# Patient Record
Sex: Female | Born: 1955 | Race: White | Hispanic: No | Smoking: Never smoker
Health system: Southern US, Community
[De-identification: ages and names within clinical notes are randomized; demographics above are authoritative.]

## PROBLEM LIST (undated history)

## (undated) DIAGNOSIS — R87619 Unspecified abnormal cytological findings in specimens from cervix uteri: Secondary | ICD-10-CM

## (undated) DIAGNOSIS — I2510 Atherosclerotic heart disease of native coronary artery without angina pectoris: Secondary | ICD-10-CM

## (undated) DIAGNOSIS — R55 Syncope and collapse: Secondary | ICD-10-CM

## (undated) DIAGNOSIS — K219 Gastro-esophageal reflux disease without esophagitis: Secondary | ICD-10-CM

## (undated) DIAGNOSIS — Z8719 Personal history of other diseases of the digestive system: Secondary | ICD-10-CM

## (undated) DIAGNOSIS — I10 Essential (primary) hypertension: Secondary | ICD-10-CM

## (undated) DIAGNOSIS — F329 Major depressive disorder, single episode, unspecified: Secondary | ICD-10-CM

## (undated) DIAGNOSIS — G459 Transient cerebral ischemic attack, unspecified: Secondary | ICD-10-CM

## (undated) DIAGNOSIS — Z87898 Personal history of other specified conditions: Secondary | ICD-10-CM

## (undated) DIAGNOSIS — F32A Depression, unspecified: Secondary | ICD-10-CM

## (undated) HISTORY — DX: Essential (primary) hypertension: I10

## (undated) HISTORY — PX: OTHER SURGICAL HISTORY: SHX169

## (undated) HISTORY — DX: Syncope and collapse: R55

## (undated) HISTORY — DX: Personal history of other specified conditions: Z87.898

## (undated) HISTORY — PX: ESOPHAGOGASTRODUODENOSCOPY: SHX1529

## (undated) HISTORY — DX: Unspecified abnormal cytological findings in specimens from cervix uteri: R87.619

## (undated) HISTORY — PX: COLONOSCOPY: SHX174

## (undated) HISTORY — DX: Depression, unspecified: F32.A

## (undated) HISTORY — DX: Major depressive disorder, single episode, unspecified: F32.9

---

## 1978-11-23 HISTORY — PX: DILATION AND CURETTAGE OF UTERUS: SHX78

## 1981-11-23 HISTORY — PX: BREAST SURGERY: SHX581

## 1999-05-22 ENCOUNTER — Other Ambulatory Visit: Admission: RE | Admit: 1999-05-22 | Discharge: 1999-05-22 | Payer: Self-pay

## 2000-07-22 ENCOUNTER — Other Ambulatory Visit: Admission: RE | Admit: 2000-07-22 | Discharge: 2000-07-22 | Payer: Self-pay | Admitting: *Deleted

## 2000-11-03 ENCOUNTER — Encounter (INDEPENDENT_AMBULATORY_CARE_PROVIDER_SITE_OTHER): Payer: Self-pay | Admitting: Specialist

## 2000-11-03 ENCOUNTER — Ambulatory Visit (HOSPITAL_COMMUNITY): Admission: RE | Admit: 2000-11-03 | Discharge: 2000-11-03 | Payer: Self-pay | Admitting: Gastroenterology

## 2003-11-24 DIAGNOSIS — R87619 Unspecified abnormal cytological findings in specimens from cervix uteri: Secondary | ICD-10-CM

## 2003-11-24 HISTORY — DX: Unspecified abnormal cytological findings in specimens from cervix uteri: R87.619

## 2004-05-14 ENCOUNTER — Other Ambulatory Visit: Admission: RE | Admit: 2004-05-14 | Discharge: 2004-05-14 | Payer: Self-pay | Admitting: Internal Medicine

## 2004-06-10 ENCOUNTER — Other Ambulatory Visit: Admission: RE | Admit: 2004-06-10 | Discharge: 2004-06-10 | Payer: Self-pay | Admitting: Obstetrics and Gynecology

## 2005-05-14 ENCOUNTER — Other Ambulatory Visit: Admission: RE | Admit: 2005-05-14 | Discharge: 2005-05-14 | Payer: Self-pay | Admitting: Obstetrics and Gynecology

## 2005-11-05 ENCOUNTER — Other Ambulatory Visit: Admission: RE | Admit: 2005-11-05 | Discharge: 2005-11-05 | Payer: Self-pay | Admitting: Obstetrics and Gynecology

## 2006-01-14 ENCOUNTER — Ambulatory Visit (HOSPITAL_COMMUNITY): Admission: RE | Admit: 2006-01-14 | Discharge: 2006-01-14 | Payer: Self-pay | Admitting: Obstetrics and Gynecology

## 2006-01-14 ENCOUNTER — Encounter (INDEPENDENT_AMBULATORY_CARE_PROVIDER_SITE_OTHER): Payer: Self-pay | Admitting: *Deleted

## 2006-05-13 ENCOUNTER — Other Ambulatory Visit: Admission: RE | Admit: 2006-05-13 | Discharge: 2006-05-13 | Payer: Self-pay | Admitting: Obstetrics and Gynecology

## 2006-12-01 ENCOUNTER — Other Ambulatory Visit: Admission: RE | Admit: 2006-12-01 | Discharge: 2006-12-01 | Payer: Self-pay | Admitting: Obstetrics and Gynecology

## 2008-12-14 DIAGNOSIS — R55 Syncope and collapse: Secondary | ICD-10-CM

## 2008-12-14 HISTORY — DX: Syncope and collapse: R55

## 2010-08-12 ENCOUNTER — Emergency Department (HOSPITAL_COMMUNITY): Admission: EM | Admit: 2010-08-12 | Discharge: 2010-08-12 | Payer: Self-pay | Admitting: Emergency Medicine

## 2011-02-05 LAB — PROTIME-INR
INR: 0.97 (ref 0.00–1.49)
Prothrombin Time: 13.1 seconds (ref 11.6–15.2)

## 2011-02-05 LAB — CBC
MCH: 30.9 pg (ref 26.0–34.0)
MCV: 87.4 fL (ref 78.0–100.0)
Platelets: 202 10*3/uL (ref 150–400)
RDW: 13.2 % (ref 11.5–15.5)
WBC: 9.8 10*3/uL (ref 4.0–10.5)

## 2011-02-05 LAB — DIFFERENTIAL
Lymphocytes Relative: 25 % (ref 12–46)
Lymphs Abs: 2.5 10*3/uL (ref 0.7–4.0)
Monocytes Absolute: 0.5 10*3/uL (ref 0.1–1.0)
Monocytes Relative: 5 % (ref 3–12)
Neutro Abs: 6.7 10*3/uL (ref 1.7–7.7)

## 2011-02-05 LAB — COMPREHENSIVE METABOLIC PANEL
Albumin: 4.1 g/dL (ref 3.5–5.2)
BUN: 16 mg/dL (ref 6–23)
Creatinine, Ser: 0.8 mg/dL (ref 0.4–1.2)
Total Protein: 7.5 g/dL (ref 6.0–8.3)

## 2011-02-05 LAB — POCT CARDIAC MARKERS
CKMB, poc: <1 ng/mL — ABNORMAL LOW (ref 1.0–8.0)
CKMB, poc: <1 ng/mL — ABNORMAL LOW (ref 1.0–8.0)
Myoglobin, poc: 47.3 ng/mL (ref 12–200)

## 2011-04-10 NOTE — H&P (Signed)
Kristen [name], Kristen [name]             ACCOUNT NO.:  [id number]   MEDICAL RECORD NO.:  [id number]          PATIENT TYPE:  AMB   LOCATION:                                FACILITY:  [facility]   PHYSICIAN:  Gerald Leitz, MD          DATE OF BIRTH:  06-21-1956   DATE OF ADMISSION:  01/14/2006  DATE OF DISCHARGE:                                HISTORY & PHYSICAL   HISTORY OF PRESENT ILLNESS:  This is a 55 year old G1, P1 with a history of  cervical dysplasia.  She has recurrent cervical dysplasia CIN I.  She also  has endocervical polyps on examination that were difficult to remove in the  office and is here to undergo cold knife conization for hopefully definitive  therapy.  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   PAST MEDICAL HISTORY:  1.  Depression.  2.  Hypertension.  3.  Acid reflux.   PAST SURGICAL HISTORY:  1.  D&C.  2.  Breast biopsy which was benign.   PAST OB HISTORY:  Spontaneous vaginal delivery x1.   MEDICATIONS:  1.  Prozac 20 mg three p.o. daily.  2.  Benazepril for hypertension.  3.  Hydrochlorothiazide.  4.  Prevacid.   ALLERGIES:  No known drug allergies.   SOCIAL:  She denies tobacco, alcohol, or illicit drug use.  She is single.   FAMILY HISTORY:  No family history of breast, ovarian, or colon cancer.   REVIEW OF SYSTEMS:  Negative except as stated in history of current illness.   PHYSICAL EXAMINATION:  VITAL SIGNS:  Blood pressure 144/86, heart rate 64,  weight 210 pounds, height 5 feet 5-1/2 inches.  CARDIOVASCULAR:  Regular rate and rhythm.  LUNGS:  Clear to auscultation bilaterally.  ABDOMEN:  Soft, nontender, nondistended.  No masses.  Positive bowel sounds.  EXTREMITIES:  No clubbing, cyanosis, edema.  PELVIC:  Normal external female genitalia.  No vulvar or vaginal lesions.  Polyps noted in the endocervical canal.  Bimanual examination revealed  normal sized uterus.  No adnexal masses or tenderness.   IMPRESSION AND PLAN:  Recurrent cervical dysplasia and  endocervical polyps.  Recommend cold knife conization.  Risks, benefits, and alternatives of  surgery were discussed with the patient including infection, bleeding,  damage to surrounding organs, risk of transfusion were reviewed with the  patient including HIV, hepatitis B, C.  Patient understands risks and wishes  to proceed with cold knife conization.      Gerald Leitz, MD  Electronically Signed     TC/MEDQ  D:  01/07/2006  T:  01/07/2006  Job:  [id number]

## 2011-04-10 NOTE — Op Note (Signed)
Kristen [name], CHANDRAN             ACCOUNT NO.:  [id number]   MEDICAL RECORD NO.:  [id number]          PATIENT TYPE:  AMB   LOCATION:  SDC                           FACILITY:  [facility]   PHYSICIAN:  Gerald Leitz, MD          DATE OF BIRTH:  1956-01-10   DATE OF PROCEDURE:  01/15/2006  DATE OF DISCHARGE:                                 OPERATIVE REPORT   PREOPERATIVE DIAGNOSES:  1.  _recurrent  cervical dysplasia.  2.  Endocervical polyp.   POSTOPERATIVE DIAGNOSES:  1.  recurrent  cervical dysplasia.  2.  Endocervical polyp.   OPERATION/PROCEDURE:  Cold knife conization.   ANESTHESIA:  General.   COMPLICATIONS:  None.   SURGEON:  Gerald Leitz, M.D.   ESTIMATED BLOOD LOSS:  Approximately 25 mL.   FINDINGS:  Endocervical polyp.   DESCRIPTION OF PROCEDURE:  The patient was taken to the operating room where  she was placed under general anesthesia.  She was prepped and draped in the  usual sterile fashion.  The anterior lip of the cervix grasped with a single-  tooth tenaculum and paracervical block was performed using approximately 18  mL of 0.25% Marcaine with epinephrine.  A 0 Vicryl suture was placed at the  lateral aspect of the cervix at the 3 o'clock and 9 o'clock positions to  help with traction and hemostasis.  The single-tooth tenaculum on the  anterior lip of the cervix was removed. Cervical conization was performed  with an 11-blade scalpel.  Specimen removed and sent to pathology.  It was  tagged at the 12 o'clock position.  Hemostasis was obtained using Monsel's  and pressure.  Once hemostasis was assured, Surgicel was placed into the  incision.  Lateral sutures were tied over the Surgicel.  All instruments  were removed from the patient's vagina.  The patient tolerated the procedure  well.  Sponge, lap and needle counts were correct x2.  The patient was  awakened from anesthesia and taken to the recovery room in stable condition.      Gerald Leitz, MD  Electronically  Signed     TC/MEDQ  D:  01/15/2006  T:  01/15/2006  Job:  [id number]

## 2011-04-10 NOTE — Op Note (Signed)
[facility]  Patient:    Kristen [name], Kristen [name]                    MRN: [id number] Adm. Date:  [id number] Attending:  Nelda Marseille CC:         Charlesetta Shanks, P.A., [facility] Leodis Sias, M.D.)   Operative Report  PROCEDURE:  Esophagogastroduodenoscopy with biopsy.  PREOPERATIVE DIAGNOSIS:  Chronic upper tract symptoms.  INDICATIONS:  Consent was signed after risks, benefits, methods, and options were thoroughly discussed in the office.  MEDICINES USED:  Demerol 70, Versed 10.  DESCRIPTION OF PROCEDURE:  The video endoscope was inserted by direct vision. The esophagus was normal.  In the distal esophagus was a moderate hiatal hernia, possibly a small fibrous widely-patent ring just above it.  The scope was passed easily into the stomach and advanced through the antrum and apart from some minimal antritis through a normal pylorus into a normal duodenum bulb and around the C-loop to a normal second portion of the duodenum.  The scope was withdrawn back to the bulb and a good look ruled out ulcers in that location.  The scope was withdrawn back to the stomach and retroflexed.  The angularis, cardia and fundus were pertinent for the cardia showing the hiatal hernia but no other abnormality.  The stomach was evaluated on straightened visualization with some minimal gastritis being seen but no other abnormalities.  The scope was advanced to the antrum.  Two biopsies there and two of the proximal stomach were obtained to rule out Helicobacter. Air was suctioned and the scope was slowly withdrawn.  Again, a good look at the esophagus on slow withdrawal was normal.  There was no sign of esophagitis or Barretts.  The scope was removed.  The patient tolerated the procedure well.  There were no obvious immediate complications.  ENDOSCOPIC DIAGNOSES: 1. Moderate hiatal hernia with a questionably thin widely-patent ring. 2. Minimal gastritis and  antritis, status post biopsy to rule Helicobacter    pylori. 3. Otherwise within normal limits esophagogastroduodenoscopy.  PLAN:  Continue medications.  Await pathology.  Follow-up p.r.n. or in three to four months to determine any further workup and plans and rediscuss possible surgical options. DD:  11/03/00 TD:  11/03/00 Job: [id number]

## 2012-05-04 ENCOUNTER — Encounter: Payer: Self-pay | Admitting: *Deleted

## 2012-05-04 DIAGNOSIS — Z87898 Personal history of other specified conditions: Secondary | ICD-10-CM | POA: Insufficient documentation

## 2012-05-04 DIAGNOSIS — F329 Major depressive disorder, single episode, unspecified: Secondary | ICD-10-CM | POA: Insufficient documentation

## 2012-05-04 DIAGNOSIS — F32A Depression, unspecified: Secondary | ICD-10-CM | POA: Insufficient documentation

## 2013-02-22 ENCOUNTER — Telehealth: Payer: Self-pay | Admitting: Nurse Practitioner

## 2013-02-22 NOTE — Telephone Encounter (Signed)
Patient states she has had burning with urination, urgency to void and external itching with voiding for a couple of days.  Has been drinking increased fluids and taking cranberry tablets and initially improved.  When she developed backache she used and over the counter test for UTI that shows leukocytes.  Patient denied fever.  Appointment scheduled for am and instructed to go to Urgent Care if back pain gets worse or if develops fever.

## 2013-02-22 NOTE — Telephone Encounter (Signed)
Pt thinks she may have a uti. Please call to schedule an appt.

## 2013-02-23 ENCOUNTER — Encounter: Payer: Self-pay | Admitting: Obstetrics and Gynecology

## 2013-02-23 ENCOUNTER — Ambulatory Visit (INDEPENDENT_AMBULATORY_CARE_PROVIDER_SITE_OTHER): Payer: BC Managed Care – PPO | Admitting: Obstetrics and Gynecology

## 2013-02-23 VITALS — BP 122/80 | HR 60 | Temp 96.0°F | Resp 12

## 2013-02-23 DIAGNOSIS — N390 Urinary tract infection, site not specified: Secondary | ICD-10-CM

## 2013-02-23 LAB — POCT URINALYSIS DIPSTICK
Glucose, UA: NEGATIVE
Ketones, UA: NEGATIVE
Urobilinogen, UA: NEGATIVE

## 2013-02-23 MED ORDER — FLUCONAZOLE 150 MG PO TABS: 150.0000 mg | ORAL_TABLET | Freq: Once | ORAL | Status: DC

## 2013-02-23 MED ORDER — CIPROFLOXACIN HCL 500 MG PO TABS: 500.0000 mg | ORAL_TABLET | Freq: Two times a day (BID) | ORAL | Status: DC

## 2013-02-23 NOTE — Addendum Note (Signed)
Addended by: Conley Simmonds on: 02/23/2013 09:26 AM   Modules accepted: Orders

## 2013-02-23 NOTE — Progress Notes (Signed)
Patient ID: Kristen [name], female   DOB: 1956/11/03, 57 y.o.   MRN: [id number]  Subjective  Patient presents today with hematuria and dysuria.  Patient self treated with cranberry juice and baking soda and water.  Symptoms persist.  States urinary frequency and voiding small amounts.  Felt feverish but hasn't taken temperature.  No nausea or vomiting.  Some diarrhea and cramping.  Denies back pain today. Denies history of nephrolithiasis and UTIs.  Denies vaginal bleeding.  Did over the counter test for urinary tract infection and had WBCs in urine.  Objective  General - Looks well. Back - No CVA tenderness. Abdomen - Soft, nontender, nondistended.  No hepatosplenomegaly or organomegaly. Pelvic - Normal external genitalia and urethra.  Cervix and vagina normal.  No blood noted.  Uterus small, anteverted and nontender.  No adnexal masses or tenderness.  Urinalysis - 1+ WBCs, trace blood.  Negative nitrites.  Assessment  Urinary tract infection.  Plan  Urine culture and sensitivities. Ciprofloxacin 500 mg po bid x 7 days.

## 2013-02-23 NOTE — Patient Instructions (Signed)
Urinary Tract Infection Urinary tract infections (UTIs) can develop anywhere along your urinary tract. Your urinary tract is your body's drainage system for removing wastes and extra water. Your urinary tract includes two kidneys, two ureters, a bladder, and a urethra. Your kidneys are a pair of bean-shaped organs. Each kidney is about the size of your fist. They are located below your ribs, one on each side of your spine. CAUSES Infections are caused by microbes, which are microscopic organisms, including fungi, viruses, and bacteria. These organisms are so small that they can only be seen through a microscope. Bacteria are the microbes that most commonly cause UTIs. SYMPTOMS  Symptoms of UTIs may vary by age and gender of the patient and by the location of the infection. Symptoms in young women typically include a frequent and intense urge to urinate and a painful, burning feeling in the bladder or urethra during urination. Older women and men are more likely to be tired, shaky, and weak and have muscle aches and abdominal pain. A fever may mean the infection is in your kidneys. Other symptoms of a kidney infection include pain in your back or sides below the ribs, nausea, and vomiting. DIAGNOSIS To diagnose a UTI, your caregiver will ask you about your symptoms. Your caregiver also will ask to provide a urine sample. The urine sample will be tested for bacteria and white blood cells. White blood cells are made by your body to help fight infection. TREATMENT  Typically, UTIs can be treated with medication. Because most UTIs are caused by a bacterial infection, they usually can be treated with the use of antibiotics. The choice of antibiotic and length of treatment depend on your symptoms and the type of bacteria causing your infection. HOME CARE INSTRUCTIONS  If you were prescribed antibiotics, take them exactly as your caregiver instructs you. Finish the medication even if you feel better after you  have only taken some of the medication.  Drink enough water and fluids to keep your urine clear or pale yellow.  Avoid caffeine, tea, and carbonated beverages. They tend to irritate your bladder.  Empty your bladder often. Avoid holding urine for long periods of time.  Empty your bladder before and after sexual intercourse.  After a bowel movement, women should cleanse from front to back. Use each tissue only once. SEEK MEDICAL CARE IF:   You have back pain.  You develop a fever.  Your symptoms do not begin to resolve within 3 days. SEEK IMMEDIATE MEDICAL CARE IF:   You have severe back pain or lower abdominal pain.  You develop chills.  You have nausea or vomiting.  You have continued burning or discomfort with urination. MAKE SURE YOU:   Understand these instructions.  Will watch your condition.  Will get help right away if you are not doing well or get worse. Document Released: 08/19/2005 Document Revised: 05/10/2012 Document Reviewed: 12/18/2011 Exit[employer] Patient Information 2013 [employer].  

## 2013-02-24 LAB — CULTURE, URINE COMPREHENSIVE
Colony Count: NO GROWTH
Organism ID, Bacteria: NO GROWTH

## 2013-04-05 ENCOUNTER — Other Ambulatory Visit: Payer: BC Managed Care – PPO

## 2013-04-05 ENCOUNTER — Other Ambulatory Visit: Payer: Self-pay | Admitting: Obstetrics and Gynecology

## 2013-04-05 DIAGNOSIS — E785 Hyperlipidemia, unspecified: Secondary | ICD-10-CM

## 2013-04-05 DIAGNOSIS — E7800 Pure hypercholesterolemia, unspecified: Secondary | ICD-10-CM

## 2013-04-05 LAB — LIPID PANEL: HDL: 57 mg/dL (ref >39)

## 2013-10-01 ENCOUNTER — Other Ambulatory Visit: Payer: Self-pay | Admitting: Nurse Practitioner

## 2013-10-02 ENCOUNTER — Telehealth: Payer: Self-pay

## 2013-10-02 NOTE — Telephone Encounter (Signed)
Lmtcb, patient needs to schedule AEX//kn

## 2013-10-05 NOTE — Telephone Encounter (Signed)
Left Message To Call Back Re: Scheduling AEX before any refills

## 2013-10-09 NOTE — Telephone Encounter (Signed)
Pt is returning a call to [place] pt also states she will call back after thanksgiving to schedule an appointment.

## 2013-10-09 NOTE — Telephone Encounter (Signed)
Pt has been contacted twice.  No return call.  RX denied.

## 2013-10-25 NOTE — Telephone Encounter (Signed)
Patient dropped off a form she needs for work. It is a simple form so I did not collect payment. I did let the patient know we may call her if there is a charge and she understands. Please call patient when the form is ready or let me know if I need to call her for payment. Routing form to P. Berneice Gandy, NP.

## 2013-10-26 NOTE — Telephone Encounter (Signed)
Note and chart sent back to you - needs TDaP chart indicates last tetanus over 10 yrs. And if school is requiring TB testing then can get a t school nurse or PCP.

## 2013-10-26 NOTE — Telephone Encounter (Signed)
Spoke with patient who is coming to the office to put up the form as it is. She is aware she needs a TDAP and wants to have it at her next appointment here, 11/07/13. Patient is also aware she may need to have TB testing done somewhere else as we do not do this here in our office.

## 2013-11-07 ENCOUNTER — Ambulatory Visit: Payer: BC Managed Care – PPO | Admitting: Nurse Practitioner

## 2014-04-06 ENCOUNTER — Telehealth: Payer: Self-pay | Admitting: Nurse Practitioner

## 2014-04-06 MED ORDER — FLUOXETINE HCL 20 MG PO CAPS: ORAL_CAPSULE | ORAL | Status: DC

## 2014-04-06 NOTE — Telephone Encounter (Signed)
Rx filled

## 2014-04-06 NOTE — Telephone Encounter (Signed)
Spoke with patient. Patient states that she has been increasingly depressed lately. "This is a really tough month for me. This is the month of my wedding anniversary and the month that my husband died in. All day on Wednesday I was crying. I am going out of town this weekend and I do not have any refills for my Prozac." Patient was last seen on 11/30/12 with Lauro FranklinPatricia Rolen-Grubb, FNP at which time Prozac was refilled. Patient is due for AEX and would like to schedule at this time. Advised would send one month of prescription to pharmacy of choice until she could be seen for AEX. Patient agreeable. Requesting Wednesday morning appointment. Appointment scheduled for 6/10 at 9:15 with Lauro FranklinPatricia Rolen-Grubb, FNP. Patient agreeable to date and time.   Routing to provider for final review. Patient agreeable to disposition. Will close encounter  Routing to Dr.Silva as covering CC: Lauro FranklinPatricia Rolen-Grubb, FNP

## 2014-04-06 NOTE — Telephone Encounter (Signed)
Patient is asking to talk with patty's nurse. Patient has been very emotional lately "crying more than usual" . Patient also needs a refill of her depression medication. Patient is going out of town and needs a refill today.

## 2014-05-02 ENCOUNTER — Ambulatory Visit: Payer: BC Managed Care – PPO | Admitting: Nurse Practitioner

## 2014-07-03 ENCOUNTER — Encounter: Payer: Self-pay | Admitting: Nurse Practitioner

## 2014-07-03 ENCOUNTER — Ambulatory Visit (INDEPENDENT_AMBULATORY_CARE_PROVIDER_SITE_OTHER): Payer: BC Managed Care – PPO | Admitting: Nurse Practitioner

## 2014-07-03 VITALS — BP 110/64 | HR 60 | Ht 64.0 in | Wt 172.0 lb

## 2014-07-03 DIAGNOSIS — I10 Essential (primary) hypertension: Secondary | ICD-10-CM

## 2014-07-03 DIAGNOSIS — Z01419 Encounter for gynecological examination (general) (routine) without abnormal findings: Secondary | ICD-10-CM

## 2014-07-03 DIAGNOSIS — F4321 Adjustment disorder with depressed mood: Secondary | ICD-10-CM

## 2014-07-03 MED ORDER — FLUOXETINE HCL 20 MG PO CAPS: ORAL_CAPSULE | ORAL | Status: DC

## 2014-07-03 NOTE — Progress Notes (Signed)
Patient ID: Kristen [name], female   DOB: 24-Jun-1956, 58 y.o.   MRN: [id number] 58 y.o. G1P1 Widowed Caucasian Fe here for annual exam. No vaso symptoms.  Some vaginal dryness.  Occasionally uses OTC coconut oil.   Not  SA.Marland Kitchen Enjoys her grandchildren. Doing well on this dose of Prozac.  No further crying spells.  No Urinary symptoms now -had  UTI in April and is resolved.  Plans to get recheck at PCP with labs.  Patient's last menstrual period was 11/24/2003.          Sexually active: No.  The current method of family planning is abstinence and post menopausal status.    Exercising: Yes.    yoga Smoker:  no  Health Maintenance: Pap:  08/26/12, WNL, neg HR HPV MMG:  01/27/12, Bi-Rads 1:  Negative  Will schedule Colonoscopy & Endoscopy:  07/2010, normal, repeat in 10 years TDaP:  2011 Labs: PCP, has apt in September    reports that she has been passively smoking Cigarettes.  She has been smoking about 0.00 packs per day. She has never used smokeless tobacco. She reports that she drinks alcohol. She reports that she does not use illicit drugs.  Past Medical History  Diagnosis Date  . Hypertension   . Depression   . H/O syncope   . Syncope and collapse 12/14/2008    echo EF 55-60% normal  . Abnormal Pap smear of cervix 2005    colpo bx and conization - normal since    Past Surgical History  Procedure Laterality Date  . Other surgical history      benign lumpectomy left  . Breast surgery Right 1983    benign biopsy  . Dilation and curettage of uterus  1980    Current Outpatient Prescriptions  Medication Sig Dispense Refill  . aspirin 81 MG tablet Take 81 mg by mouth daily.      . benazepril-hydrochlorthiazide (LOTENSIN HCT) 20-12.5 MG per tablet Take 1 tablet by mouth daily.      Marland Kitchen FLUoxetine (PROZAC) 20 MG capsule Three daily  270 capsule  3  . Vitamin D, Ergocalciferol, (DRISDOL) 50000 UNITS CAPS        No current facility-administered medications for this visit.    Family  History  Problem Relation Age of Onset  . Heart disease Mother   . Arrhythmia Mother     a fib  . Breast cancer Mother 25  . Heart attack Father 52  . Stroke Father   . Cancer Maternal Grandmother     colon    ROS:  Pertinent items are noted in HPI.  Otherwise, a comprehensive ROS was negative.  Exam:   BP 110/64  Pulse 60  Ht 5\' 4"  (1.626 m)  Wt 172 lb (78.019 kg)  BMI 29.51 kg/m2  LMP 11/24/2003 Height: 5\' 4"  (162.6 cm)  Ht Readings from Last 3 Encounters:  07/03/14 5\' 4"  (1.626 m)    General appearance: alert, cooperative and appears stated age Head: Normocephalic, without obvious abnormality, atraumatic Neck: no adenopathy, supple, symmetrical, trachea midline and thyroid normal to inspection and palpation Lungs: clear to auscultation bilaterally Breasts: normal appearance, no masses or tenderness Heart: regular rate and rhythm Abdomen: soft, non-tender; no masses,  no organomegaly Extremities: extremities normal, atraumatic, no cyanosis or edema Skin: Skin color, texture, turgor normal. No rashes or lesions Lymph nodes: Cervical, supraclavicular, and axillary nodes normal. No abnormal inguinal nodes palpated Neurologic: Grossly normal   Pelvic: External genitalia:  no lesions  Urethra:  normal appearing urethra with no masses, tenderness or lesions              Bartholin's and Skene's: normal                 Vagina: normal appearing vagina with normal color and discharge, no lesions              Cervix: anteverted              Pap taken: No. 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

## 2014-07-03 NOTE — Patient Instructions (Signed)

## 2014-07-08 NOTE — Progress Notes (Signed)
Encounter reviewed by Dr. Brook Silva.  

## 2014-09-24 ENCOUNTER — Encounter: Payer: Self-pay | Admitting: Nurse Practitioner

## 2014-10-23 ENCOUNTER — Encounter: Payer: Self-pay | Admitting: Nurse Practitioner

## 2014-10-23 ENCOUNTER — Ambulatory Visit (INDEPENDENT_AMBULATORY_CARE_PROVIDER_SITE_OTHER): Payer: BC Managed Care – PPO | Admitting: Nurse Practitioner

## 2014-10-23 ENCOUNTER — Telehealth: Payer: Self-pay

## 2014-10-23 VITALS — BP 130/70 | HR 72 | Temp 98.2°F | Resp 18 | Wt 174.0 lb

## 2014-10-23 DIAGNOSIS — R350 Frequency of micturition: Secondary | ICD-10-CM

## 2014-10-23 LAB — POCT URINALYSIS DIPSTICK
Bilirubin, UA: NEGATIVE
GLUCOSE UA: NEGATIVE
Ketones, UA: NEGATIVE
NITRITE UA: NEGATIVE
PROTEIN UA: NEGATIVE
UROBILINOGEN UA: NEGATIVE
pH, UA: 7

## 2014-10-23 MED ORDER — NITROFURANTOIN MONOHYD MACRO 100 MG PO CAPS: 100.0000 mg | ORAL_CAPSULE | Freq: Two times a day (BID) | ORAL | Status: DC

## 2014-10-23 MED ORDER — FLUCONAZOLE 150 MG PO TABS: 150.0000 mg | ORAL_TABLET | Freq: Once | ORAL | Status: DC

## 2014-10-23 NOTE — Telephone Encounter (Signed)
Pt is having UTI symptoms since 10/18/14 and would like a nurse to call back.

## 2014-10-23 NOTE — Patient Instructions (Signed)

## 2014-10-23 NOTE — Progress Notes (Signed)
S:  [age] y.o.Widowed White female presents with complaint of UTI. Symptoms began on last Wednesday or Thursday. With symptoms of burning with urination, dysuria, urinary frequency, urinary urgency.   Pertinent constitutional symptoms including:  some chills, fatigue, low grade fever last pm and malaise.  Not  sexually active.   She is postmenopausal and does have some vaginal dryness. Last UTI with negative culture was 02/2013.   ROS: feels well and fatigued  O alert, oriented to person, place, and time   healthy,  alert and  not in acute distress  Mild pressure over the suprapubic area  No CVA tenderness  pelvic: deferred   Diagnostic Test:    Urinalysis: + RBC and WBC   PROCEDURES:  Urine C&S and micro  Assessment: R/O UTI   Plan:    Maintain adequate hydration. Follow up if symptoms not improving, and as needed.   Medication Therapy: Macrobid 100 mg BID # 14   Lab:TOC if Urine Culture is positive    RV

## 2014-10-23 NOTE — Telephone Encounter (Signed)
Spoke with patient. Patient states that last week on 11/26 she began to have increased urinary urgency. "Then on Thursday I would have to go to the bathroom so bad and when I went it was very little and was burning. I got an OTC Azo pack with cranberry tablets. It has been helping but I still have the symptoms when I wake up in the morning." Patient denies back pain. "I have been feeling really hot but was unable to take my temperature." Advised patient will need to be seen for evaluation. Patient is agreeable and requests appointment with Lauro FranklinPatricia Rolen-Grubb, FNP. Appointment scheduled for today at 2:15pm with Lauro FranklinPatricia Rolen-Grubb, FNP. Patient is agreeable and date and time.  Routing to provider for final review. Patient agreeable to disposition. Will close encounter

## 2014-10-24 LAB — URINE CULTURE
Colony Count: NO GROWTH
Organism ID, Bacteria: NO GROWTH

## 2014-10-24 LAB — URINALYSIS, MICROSCOPIC ONLY
BACTERIA UA: NONE SEEN
CASTS: NONE SEEN
Crystals: NONE SEEN

## 2014-10-28 NOTE — Progress Notes (Signed)
Encounter reviewed by Dr. Chesley Valls Silva.  

## 2015-09-04 ENCOUNTER — Other Ambulatory Visit: Payer: Self-pay | Admitting: Nurse Practitioner

## 2015-09-04 NOTE — Telephone Encounter (Signed)
She must have AEX before new refill.  This RX is only for 1 month.

## 2015-09-04 NOTE — Telephone Encounter (Addendum)
Medication refill request: Prozac  Last AEX:  07/03/14 PG Next AEX: None Last MMG (if hormonal medication request): ? Refill authorized: 07/03/14 #[id number].   Needs AEX. Left Voicemail to call back.

## 2015-09-15 ENCOUNTER — Other Ambulatory Visit: Payer: Self-pay | Admitting: Nurse Practitioner

## 2015-11-04 ENCOUNTER — Other Ambulatory Visit: Payer: Self-pay | Admitting: Family Medicine

## 2015-11-04 ENCOUNTER — Other Ambulatory Visit (HOSPITAL_COMMUNITY)
Admission: RE | Admit: 2015-11-04 | Discharge: 2015-11-04 | Disposition: A | Discharge status: Home / Self Care | Payer: BC Managed Care – PPO | Source: Ambulatory Visit | Attending: Family Medicine | Admitting: Family Medicine

## 2015-11-04 DIAGNOSIS — Z01411 Encounter for gynecological examination (general) (routine) with abnormal findings: Secondary | ICD-10-CM | POA: Diagnosis present

## 2015-11-04 DIAGNOSIS — Z1151 Encounter for screening for human papillomavirus (HPV): Secondary | ICD-10-CM | POA: Insufficient documentation

## 2015-11-05 LAB — CYTOLOGY - PAP

## 2017-12-07 ENCOUNTER — Other Ambulatory Visit: Payer: Self-pay | Admitting: Family Medicine

## 2017-12-07 ENCOUNTER — Other Ambulatory Visit (HOSPITAL_COMMUNITY)
Admission: RE | Admit: 2017-12-07 | Discharge: 2017-12-07 | Disposition: A | Discharge status: Home / Self Care | Payer: BC Managed Care – PPO | Source: Ambulatory Visit | Attending: Family Medicine | Admitting: Family Medicine

## 2017-12-07 DIAGNOSIS — Z01411 Encounter for gynecological examination (general) (routine) with abnormal findings: Secondary | ICD-10-CM | POA: Insufficient documentation

## 2017-12-09 LAB — CYTOLOGY - PAP: DIAGNOSIS: NEGATIVE

## 2019-10-30 ENCOUNTER — Other Ambulatory Visit: Payer: Self-pay

## 2019-10-30 DIAGNOSIS — Z20822 Contact with and (suspected) exposure to covid-19: Secondary | ICD-10-CM

## 2019-10-31 LAB — NOVEL CORONAVIRUS, NAA: SARS-CoV-2, NAA: NOT DETECTED

## 2020-11-29 ENCOUNTER — Other Ambulatory Visit: Payer: Self-pay

## 2020-11-29 DIAGNOSIS — Z20822 Contact with and (suspected) exposure to covid-19: Secondary | ICD-10-CM

## 2020-12-03 LAB — NOVEL CORONAVIRUS, NAA: SARS-CoV-2, NAA: NOT DETECTED

## 2020-12-26 ENCOUNTER — Ambulatory Visit: Payer: Self-pay | Attending: Internal Medicine

## 2020-12-26 DIAGNOSIS — Z23 Encounter for immunization: Secondary | ICD-10-CM

## 2020-12-26 NOTE — Progress Notes (Signed)
   Covid-19 Vaccination [facility]  Name:  KENNETH LAX    MRN: [id number] DOB: 1956/10/11  12/26/2020  Ms. Klooster was observed post Covid-19 immunization for 15 minutes without incident. She was provided with Vaccine Information Sheet and instruction to access the V-Safe system.   Ms. Raftery was instructed to call 911 with any severe reactions post vaccine: Marland Kitchen Difficulty breathing  . Swelling of face and throat  . A fast heartbeat  . A bad rash all over body  . Dizziness and weakness   Immunizations Administered    Name Date Dose VIS Date Route   PFIZER Comrnaty(Gray TOP) Covid-19 Vaccine 12/26/2020  1:23 PM 0.3 mL 10/31/2020 Intramuscular   Manufacturer: [employer], [employer]   Lot: [id number]   NDC: [phone]

## 2021-03-23 DIAGNOSIS — K5792 Diverticulitis of intestine, part unspecified, without perforation or abscess without bleeding: Secondary | ICD-10-CM | POA: Insufficient documentation

## 2021-04-01 ENCOUNTER — Ambulatory Visit: Payer: BC Managed Care – PPO | Admitting: Podiatry

## 2021-04-01 ENCOUNTER — Other Ambulatory Visit: Payer: Self-pay

## 2021-04-01 DIAGNOSIS — B353 Tinea pedis: Secondary | ICD-10-CM

## 2021-04-01 DIAGNOSIS — B351 Tinea unguium: Secondary | ICD-10-CM | POA: Diagnosis not present

## 2021-04-01 MED ORDER — TERBINAFINE HCL 250 MG PO TABS: 250.0000 mg | ORAL_TABLET | Freq: Every day | ORAL | 0 refills | Status: DC

## 2021-04-01 NOTE — Patient Instructions (Signed)
Terbinafine tablets What is this medicine? TERBINAFINE (TER bin a feen) is an antifungal medicine. It is used to treat certain kinds of fungal or yeast infections. This medicine may be used for other purposes; ask your health care provider or pharmacist if you have questions. COMMON BRAND NAME(S): Lamisil, Terbinex What should I tell my health care provider before I take this medicine? They need to know if you have any of these conditions:  drink alcoholic beverages  kidney disease  liver disease  an unusual or allergic reaction to terbinafine, other medicines, foods, dyes, or preservatives  pregnant or trying to get pregnant  breast-feeding How should I use this medicine? Take this medicine by mouth with a full glass of water. Follow the directions on the prescription label. You can take this medicine with food or on an empty stomach. Take your medicine at regular intervals. Do not take your medicine more often than directed. Do not skip doses or stop your medicine early even if you feel better. Do not stop taking except on your doctor's advice. A special MedGuide will be given to you by the pharmacist with each prescription and refill. Be sure to read this information carefully each time. Talk to your pediatrician regarding the use of this medicine in children. Special care may be needed. Overdosage: If you think you have taken too much of this medicine contact a poison control center or emergency room at once. NOTE: This medicine is only for you. Do not share this medicine with others. What if I miss a dose? If you miss a dose, take it as soon as you can. If it is almost time for your next dose, take only that dose. Do not take double or extra doses. What may interact with this medicine? Do not take this medicine with any of the following medications:  thioridazine This medicine may also interact with the following  medications:  beta-blockers  caffeine  cimetidine  cyclosporine  medicines for depression, anxiety, or psychotic disturbances  medicines for fungal infections like fluconazole and ketoconazole  medicines for irregular heartbeat like amiodarone, flecainide and propafenone  rifampin  warfarin This list may not describe all possible interactions. Give your health care provider a list of all the medicines, herbs, non-prescription drugs, or dietary supplements you use. Also tell them if you smoke, drink alcohol, or use illegal drugs. Some items may interact with your medicine. What should I watch for while using this medicine? Visit your doctor or health care provider regularly. Tell your doctor right away if you have nausea or vomiting, loss of appetite, stomach pain on your right upper side, yellow skin, dark urine, light stools, or are over tired. Some fungal infections need many weeks or months of treatment to cure. If you are taking this medicine for a long time, you will need to have important blood work done. This medicine may cause serious skin reactions. They can happen weeks to months after starting the medicine. Contact your health care provider right away if you notice fevers or flu-like symptoms with a rash. The rash may be red or purple and then turn into blisters or peeling of the skin. Or, you might notice a red rash with swelling of the face, lips or lymph nodes in your neck or under your arms. What side effects may I notice from receiving this medicine? Side effects that you should report to your doctor or health care professional as soon as possible:  allergic reactions like skin rash or hives,   swelling of the face, lips, or tongue  changes in vision  dark urine  fever or infection  general ill feeling or flu-like symptoms  light-colored stools  loss of appetite, nausea  rash, fever, and swollen lymph nodes  redness, blistering, peeling or loosening of the  skin, including inside the mouth  right upper belly pain  unusually weak or tired  yellowing of the eyes or skin Side effects that usually do not require medical attention (report to your doctor or health care professional if they continue or are bothersome):  changes in taste  diarrhea  hair loss  muscle or joint pain  stomach gas  stomach upset This list may not describe all possible side effects. Call your doctor for medical advice about side effects. You may report side effects to FDA at 1-800-FDA-1088. Where should I keep my medicine? Keep out of the reach of children. Store at room temperature below 25 degrees C (77 degrees F). Protect from light. Throw away any unused medicine after the expiration date. NOTE: This sheet is a summary. It may not cover all possible information. If you have questions about this medicine, talk to your doctor, pharmacist, or health care provider.  2021 [employer]vier/[employer] Standard (2019-02-17 15:37:07)  

## 2021-04-02 NOTE — Progress Notes (Signed)
Subjective:   Patient ID: Kristen [name], female   DOB: 65 y.o.   MRN: [id number]   HPI 65 year old female presents the office today for concerns of toenail thickening, discoloration.  She also gets peeling skin to her feet as well as interdigitally.  She is not had any recent treatment.  Is been ongoing for a long time she reports.  She previously saw a podiatrist who told her that the medication was expensive and that even if it was treated it could always come back in the future so she did not have treatment at that point.  At this time she like to proceed with any treatment possible to help with this.  She does state that her feet sweat quite a bit.  She has no other concerns today.   Review of Systems  All other systems reviewed and are negative.  Past Medical History:  Diagnosis Date  . Abnormal Pap smear of cervix 2005   colpo bx and conization - normal since  . Depression   . H/O syncope   . Hypertension   . Syncope and collapse 12/14/2008   echo EF 55-60% normal    Past Surgical History:  Procedure Laterality Date  . BREAST SURGERY Right 1983   benign biopsy  . DILATION AND CURETTAGE OF UTERUS  1980  . OTHER SURGICAL HISTORY     benign lumpectomy left     Current Outpatient Medications:  .  terbinafine (LAMISIL) 250 MG tablet, Take 1 tablet (250 mg total) by mouth daily., Disp: 90 tablet, Rfl: 0 .  aspirin 81 MG tablet, Take 81 mg by mouth daily., Disp: , Rfl:  .  benazepril-hydrochlorthiazide (LOTENSIN HCT) 20-12.5 MG per tablet, Take 1 tablet by mouth daily., Disp: , Rfl:  .  cholecalciferol (VITAMIN D) 1000 UNITS tablet, Take 1,000 Units by mouth daily., Disp: , Rfl:  .  Cyanocobalamin (VITAMIN B 12 PO), Take by mouth., Disp: , Rfl:  .  fluconazole (DIFLUCAN) 150 MG tablet, Take 1 tablet (150 mg total) by mouth once. Take one tablet.  Repeat in 48 hours if symptoms are not completely resolved., Disp: 2 tablet, Rfl: 0 .  FLUoxetine (PROZAC) 20 MG capsule, TAKE 3  CAPSULES DAILY AS DIRECTED, Disp: 90 capsule, Rfl: 0 .  folic acid (FOLVITE) 1 MG tablet, Take 1 mg by mouth daily., Disp: , Rfl:  .  Multiple Vitamin (MULTIVITAMIN) tablet, Take 1 tablet by mouth daily., Disp: , Rfl:  .  nitrofurantoin, macrocrystal-monohydrate, (MACROBID) 100 MG capsule, Take 1 capsule (100 mg total) by mouth 2 (two) times daily., Disp: 14 capsule, Rfl: 0 .  NON FORMULARY, Lemon Balm 12 drops a day, Disp: , Rfl:  .  Red Yeast Rice Extract (RED YEAST RICE PO), Take by mouth., Disp: , Rfl:   No Known Allergies       Objective:  Physical Exam  General: AAO x3, NAD  Dermatological: Nails appear to be hypertrophic, dystrophic with yellow-brown discoloration.  No pain in the nails there is no edema, erythema or signs of infection.  Dry, peeling skin interdigitally with erythematous base as well as the plantar foot but more interdigitally.  There is no open lesions.  No pustules.  Vascular: Dorsalis Pedis artery and Posterior Tibial artery pedal pulses are 2/4 bilateral with immedate capillary fill time. There is no pain with calf compression, swelling, warmth, erythema.   Neruologic: Grossly intact via light touch bilateral.   Musculoskeletal: No gross boney pedal deformities bilateral. No pain, crepitus,  or limitation noted with foot and ankle range of motion bilateral. Muscular strength 5/5 in all groups tested bilateral.  Gait: Unassisted, Nonantalgic.       Assessment:   Onychomycosis, tinea pedis     Plan:  -Treatment options discussed including all alternatives, risks, and complications -Etiology of symptoms were discussed -We discussed various treatment option including oral, topical as well as alternative treatments for the nail fungus as well as the tinea pedis.  We discussed other measures to help control sweating as well including antiperspirant spray, shoe, sock changes regularl..  After discussion she does proceed with oral Lamisil.  Reviewed blood work  that she had done in May 3 and her creatinine 0.81, white blood cell count was 5.9, AST 16, ALT 13.  There is no increase in neutrophils.  Due to this and when I ordered Lamisil.  I will see her back in 6 weeks or sooner if any issues are to arise.  We discussed side effects, success rates of Lamisil  Vivi Barrack DPM

## 2021-05-13 ENCOUNTER — Other Ambulatory Visit: Payer: Self-pay

## 2021-05-13 ENCOUNTER — Encounter: Payer: Self-pay | Admitting: Podiatry

## 2021-05-13 ENCOUNTER — Ambulatory Visit: Payer: BC Managed Care – PPO | Admitting: Podiatry

## 2021-05-13 DIAGNOSIS — Z79899 Other long term (current) drug therapy: Secondary | ICD-10-CM

## 2021-05-13 DIAGNOSIS — B351 Tinea unguium: Secondary | ICD-10-CM

## 2021-05-13 DIAGNOSIS — E559 Vitamin D deficiency, unspecified: Secondary | ICD-10-CM | POA: Insufficient documentation

## 2021-05-13 DIAGNOSIS — F329 Major depressive disorder, single episode, unspecified: Secondary | ICD-10-CM | POA: Insufficient documentation

## 2021-05-13 DIAGNOSIS — B353 Tinea pedis: Secondary | ICD-10-CM

## 2021-05-13 DIAGNOSIS — R7301 Impaired fasting glucose: Secondary | ICD-10-CM | POA: Insufficient documentation

## 2021-05-13 DIAGNOSIS — E2839 Other primary ovarian failure: Secondary | ICD-10-CM | POA: Insufficient documentation

## 2021-05-14 LAB — CBC WITH DIFFERENTIAL/PLATELET
Absolute Monocytes: 302 cells/uL (ref 200–950)
Basophils Absolute: 59 cells/uL (ref 0–200)
Basophils Relative: 1.1 %
Eosinophils Absolute: 189 cells/uL (ref 15–500)
Eosinophils Relative: 3.5 %
HCT: 41.5 % (ref 35.0–45.0)
Hemoglobin: 13.7 g/dL (ref 11.7–15.5)
Lymphs Abs: 2560 cells/uL (ref 850–3900)
MCH: 31 pg (ref 27.0–33.0)
MCHC: 33 g/dL (ref 32.0–36.0)
MCV: 93.9 fL (ref 80.0–100.0)
MPV: 10.8 fL (ref 7.5–12.5)
Monocytes Relative: 5.6 %
Neutro Abs: 2290 cells/uL (ref 1500–7800)
Neutrophils Relative %: 42.4 %
Platelets: 208 10*3/uL (ref 140–400)
RBC: 4.42 10*6/uL (ref 3.80–5.10)
RDW: 12.8 % (ref 11.0–15.0)
Total Lymphocyte: 47.4 %
WBC: 5.4 10*3/uL (ref 3.8–10.8)

## 2021-05-14 LAB — HEPATIC FUNCTION PANEL
AG Ratio: 1.6 (calc) (ref 1.0–2.5)
ALT: 11 U/L (ref 6–29)
AST: 16 U/L (ref 10–35)
Albumin: 4.1 g/dL (ref 3.6–5.1)
Alkaline phosphatase (APISO): 55 U/L (ref 37–153)
Bilirubin, Direct: 0.1 mg/dL (ref 0.0–0.2)
Globulin: 2.6 g/dL (calc) (ref 1.9–3.7)
Indirect Bilirubin: 0.3 mg/dL (calc) (ref 0.2–1.2)
Total Bilirubin: 0.4 mg/dL (ref 0.2–1.2)
Total Protein: 6.7 g/dL (ref 6.1–8.1)

## 2021-05-21 NOTE — Progress Notes (Signed)
Subjective: 65 year old female presents the office today for evaluation after starting Lamisil.  She still on medication she is not there is any side effects.  She is not sure the athlete's foot is better.  Appears that the medicine is helping the left big toenail started to grow out some.  No pain in the nails and no evidence of drainage or signs of infection. Denies any systemic complaints such as fevers, chills, nausea, vomiting. No acute changes since last appointment, and no other complaints at this time.   Objective: AAO x3, NAD DP/PT pulses palpable bilaterally, CRT less than 3 seconds There is clear on the proximal aspect of the hallux toenail on the left side there is no pain in the nail there is no edema, erythema or signs of infection.  No significant athlete's foot identified today or fungal infection of the skin.  There is no open lesions. No pain with calf compression, swelling, warmth, erythema  Assessment: Onychomycosis, tinea pedis on Lamisil without side effects  Plan: -All treatment options discussed with the patient including all alternatives, risks, complications.  -Continue Lamisil.  We will recheck CBC and LFT.  Continue monitoring side effects.  We will finish out the 90-day course.  If needed we can recheck and continue for an additional 30 days if needed. -Patient encouraged to call the office with any questions, concerns, change in symptoms.   Vivi Barrack DPM

## 2021-07-01 ENCOUNTER — Other Ambulatory Visit: Payer: Self-pay

## 2021-07-01 ENCOUNTER — Encounter: Payer: Self-pay | Admitting: Podiatry

## 2021-07-01 ENCOUNTER — Ambulatory Visit: Payer: BC Managed Care – PPO | Admitting: Podiatry

## 2021-07-01 DIAGNOSIS — Z79899 Other long term (current) drug therapy: Secondary | ICD-10-CM | POA: Diagnosis not present

## 2021-07-01 DIAGNOSIS — B351 Tinea unguium: Secondary | ICD-10-CM | POA: Diagnosis not present

## 2021-07-01 DIAGNOSIS — L600 Ingrowing nail: Secondary | ICD-10-CM | POA: Diagnosis not present

## 2021-07-03 ENCOUNTER — Other Ambulatory Visit: Payer: Self-pay | Admitting: Podiatry

## 2021-07-03 LAB — CBC WITH DIFFERENTIAL/PLATELET
Absolute Monocytes: 319 cells/uL (ref 200–950)
Basophils Absolute: 50 cells/uL (ref 0–200)
Basophils Relative: 0.9 %
Eosinophils Absolute: 140 cells/uL (ref 15–500)
Eosinophils Relative: 2.5 %
HCT: 42.1 % (ref 35.0–45.0)
Hemoglobin: 13.6 g/dL (ref 11.7–15.5)
Lymphs Abs: 2531 cells/uL (ref 850–3900)
MCH: 30.6 pg (ref 27.0–33.0)
MCHC: 32.3 g/dL (ref 32.0–36.0)
MCV: 94.8 fL (ref 80.0–100.0)
MPV: 10.6 fL (ref 7.5–12.5)
Monocytes Relative: 5.7 %
Neutro Abs: 2559 cells/uL (ref 1500–7800)
Neutrophils Relative %: 45.7 %
Platelets: 220 10*3/uL (ref 140–400)
RBC: 4.44 10*6/uL (ref 3.80–5.10)
RDW: 13.1 % (ref 11.0–15.0)
Total Lymphocyte: 45.2 %
WBC: 5.6 10*3/uL (ref 3.8–10.8)

## 2021-07-03 LAB — HEPATIC FUNCTION PANEL
AG Ratio: 1.6 (calc) (ref 1.0–2.5)
ALT: 15 U/L (ref 6–29)
AST: 16 U/L (ref 10–35)
Albumin: 4.1 g/dL (ref 3.6–5.1)
Alkaline phosphatase (APISO): 53 U/L (ref 37–153)
Bilirubin, Direct: 0.1 mg/dL (ref 0.0–0.2)
Globulin: 2.6 g/dL (ref 1.9–3.7)
Indirect Bilirubin: 0.3 mg/dL (ref 0.2–1.2)
Total Bilirubin: 0.4 mg/dL (ref 0.2–1.2)
Total Protein: 6.7 g/dL (ref 6.1–8.1)

## 2021-07-03 MED ORDER — TERBINAFINE HCL 250 MG PO TABS: 250.0000 mg | ORAL_TABLET | Freq: Every day | ORAL | 0 refills | Status: DC

## 2021-07-04 NOTE — Progress Notes (Signed)
Subjective: 65 year old female presents the office today for evaluation of onychomycosis.  She is currently on Lamisil tolerating medication well.  Occasion she will get tenderness in the left big toe as he gets ingrown but denies any swelling or redness or any drainage.  She tried to trim the nail as, bleeding occurred. She said the toenails are looking better in color is improving.  She has no side effects of Lamisil.  He has no other concerns today.  Objective: AAO x3, NAD DP/PT pulses palpable bilaterally, CRT less than 3 seconds Overall the color of the nails is improving particular left hallux toenail.  There is clearing on the proximal portion of the nail.  The left hallux toenail is still hypertrophic.  There is mild incurvation of the medial nail border with mild tenderness there is no edema, erythema, drainage or pus or any signs of infection. No pain with calf compression, swelling, warmth, erythema  Assessment: Onychomycosis, on Lamisil; Ingrown toenail  Plan: -All treatment options discussed with the patient including all alternatives, risks, complications.  -Regards to the ingrown toenail we discussed partial nail avulsion but she wants to hold off on this.  Continue to monitor any signs or symptoms of infection.  Epson salt soaks. -Tolerating Lamisil well and it seems to be helping.  We will recheck a CBC and LFT and continue for additional 30 days. -Patient encouraged to call the office with any questions, concerns, change in symptoms.   Vivi Barrack DPM

## 2021-08-12 ENCOUNTER — Other Ambulatory Visit (HOSPITAL_COMMUNITY)
Admission: RE | Admit: 2021-08-12 | Discharge: 2021-08-12 | Disposition: A | Discharge status: Home / Self Care | Payer: BC Managed Care – PPO | Source: Ambulatory Visit | Attending: Family Medicine | Admitting: Family Medicine

## 2021-08-12 DIAGNOSIS — Z01411 Encounter for gynecological examination (general) (routine) with abnormal findings: Secondary | ICD-10-CM | POA: Diagnosis present

## 2021-08-20 LAB — CYTOLOGY - PAP
Comment: NEGATIVE
Diagnosis: UNDETERMINED — AB
High risk HPV: NEGATIVE

## 2021-09-08 ENCOUNTER — Other Ambulatory Visit: Payer: Self-pay

## 2021-09-08 ENCOUNTER — Ambulatory Visit: Payer: BC Managed Care – PPO | Admitting: Podiatry

## 2021-09-08 ENCOUNTER — Encounter: Payer: Self-pay | Admitting: Podiatry

## 2021-09-08 DIAGNOSIS — L600 Ingrowing nail: Secondary | ICD-10-CM

## 2021-09-08 DIAGNOSIS — B351 Tinea unguium: Secondary | ICD-10-CM | POA: Diagnosis not present

## 2021-09-08 MED ORDER — EFINACONAZOLE 10 % EX SOLN: 1.0000 [drp] | Freq: Every day | CUTANEOUS | 11 refills | Status: DC

## 2021-09-08 NOTE — Patient Instructions (Signed)
Efinaconazole Topical Solution What is this medication? EFINACONAZOLE (e FEE na KON a zole) is an antifungal medicine. It is used to treat certain kinds of fungal infections of the toenail. This medicine may be used for other purposes; ask your health care provider or pharmacist if you have questions. COMMON BRAND NAME(S): JUBLIA What should I tell my care team before I take this medication? They need to know if you have any of these conditions: an unusual or allergic reaction to efinaconazole, other medicines, foods, dyes or preservatives pregnant or trying to get pregnant breast-feeding How should I use this medication? This medicine is for external use only. Do not take by mouth. Follow the directions on the label. Wash hands before and after use. Apply this medicine using the provided brush to cover the entire toenail. Do not use your medicine more often than directed. Finish the full course prescribed by your doctor or health care professional even if you think your condition is better. Do not stop using except on the advice of your doctor or health care professional. Talk to your pediatrician regarding the use of this medicine in children. While this drug may be prescribed for children as young as 6 years for selected conditions, precautions do apply. Overdosage: If you think you have taken too much of this medicine contact a poison control center or emergency room at once. NOTE: This medicine is only for you. Do not share this medicine with others. What if I miss a dose? If you miss a dose, use it as soon as you can. If it is almost time for your next dose, use only that dose. Do not use double or extra doses. What may interact with this medication? Interactions have not been studied. Do not use any other nail products (i.e., nail polish, pedicures) during treatment with this medicine. This list may not describe all possible interactions. Give your health care provider a list of all the  medicines, herbs, non-prescription drugs, or dietary supplements you use. Also tell them if you smoke, drink alcohol, or use illegal drugs. Some items may interact with your medicine. What should I watch for while using this medication? Do not get this medicine in your eyes. If you do, rinse out with plenty of cool tap water. Tell your doctor or health care professional if your symptoms do not start to get better or if they get worse. Wait for at least 10 minutes after bathing before applying this medication. After bathing, make sure that your feet are very dry. Fungal infections like moist conditions. Do not walk around barefoot. To help prevent reinfection, wear freshly washed cotton, not synthetic clothing. Tell your doctor or health care professional if you develop sores or blisters that do not heal properly. If your nail infection returns after you stop using this medicine, contact your doctor or health care professional. What side effects may I notice from receiving this medication? Side effects that you should report to your doctor or health care professional as soon as possible: allergic reactions like skin rash, itching or hives, swelling of the face, lips, or tongue ingrown toenail Side effects that usually do not require medical attention (report to your doctor or health care professional if they continue or are bothersome): mild skin irritation, burning, or itching This list may not describe all possible side effects. Call your doctor for medical advice about side effects. You may report side effects to FDA at 1-800-FDA-1088. Where should I keep my medication? Keep out of the   reach of children. Store at room temperature between 20 and 25 degrees C (68 and 77 degrees F). Keep this medicine in the original container. Throw away any unused medicine after the expiration date. This medicine is flammable. Avoid exposure to heat, fire, flame, and smoking. NOTE: This sheet is a summary. It may  not cover all possible information. If you have questions about this medicine, talk to your doctor, pharmacist, or health care provider.  2022 Elsevier/Gold Standard (2019-03-20 16:14:11)  

## 2021-09-09 NOTE — Progress Notes (Signed)
Subjective: 65 year old female presents the office today for follow-up evaluation of nail fungus.  She is going to course of Lamisil and overall she thinks that they are doing better but he still get tender along the left medial nail border as well at the distal portion of right hallux.  No edema, erythema, drainage or pus or any signs of infection.  No new concerns otherwise.  Objective: AAO x3, NAD DP/PT pulses palpable bilaterally, CRT less than 3 seconds Over the nails appear to be improved but they are still hypertrophic, dystrophic with yellow-brown discoloration with some clearing on the proximal aspects.  Incurvation present to medial aspect of the left hallux toenail along the distal portion of the right hallux.  There is no edema, erythema or signs of infection.  Open lesions. No open lesions or pre-ulcerative lesions.  No pain with calf compression, swelling, warmth, erythema  Assessment: Ingrown toenail, onychomycosis  Plan: -All treatment options discussed with the patient including all alternatives, risks, complications.  -She has completed the course of Lamisil.  Modifying for the Lamisil at this point.  To continue with topical medicines.  We will order Jublia.  If not able to get this we will do likely compound cream for onychomycosis through [facility] -Sharply debride this into portions of the nail with any complications or bleeding.  Discussed partial nail avulsion particular left hallux toenail if needed.  If symptoms continue she will consider this. -Patient encouraged to call the office with any questions, concerns, change in symptoms.    Vivi Barrack DPM

## 2021-11-11 LAB — HM COLONOSCOPY

## 2021-12-11 ENCOUNTER — Other Ambulatory Visit: Payer: Self-pay

## 2021-12-11 ENCOUNTER — Ambulatory Visit: Payer: Medicare PPO | Admitting: Podiatry

## 2021-12-11 ENCOUNTER — Encounter: Payer: Self-pay | Admitting: Podiatry

## 2021-12-11 DIAGNOSIS — Z79899 Other long term (current) drug therapy: Secondary | ICD-10-CM

## 2021-12-11 DIAGNOSIS — B351 Tinea unguium: Secondary | ICD-10-CM

## 2021-12-11 MED ORDER — EFINACONAZOLE 10 % EX SOLN: 1.0000 [drp] | Freq: Every day | CUTANEOUS | 11 refills | Status: DC

## 2021-12-11 NOTE — Patient Instructions (Signed)
Terbinafine Tablets °What is this medication? °TERBINAFINE (TER bin a feen) treats fungal infections of the nails. It belongs to a group of medications called antifungals. It will not treat infections caused by bacteria or viruses. °This medicine may be used for other purposes; ask your health care provider or pharmacist if you have questions. °COMMON BRAND NAME(S): Lamisil, Terbinex °What should I tell my care team before I take this medication? °They need to know if you have any of these conditions: °Liver disease °An unusual or allergic reaction to terbinafine, other medications, foods, dyes, or preservatives °Pregnant or trying to get pregnant °Breast-feeding °How should I use this medication? °Take this medication by mouth with water. Take it as directed on the prescription label at the same time every day. You can take it with or without food. If it upsets your stomach, take it with food. Keep taking it unless your care team tells you to stop. °A special MedGuide will be given to you by the pharmacist with each prescription and refill. Be sure to read this information carefully each time. °Talk to your care team regarding the use of this medication in children. Special care may be needed. °Overdosage: If you think you have taken too much of this medicine contact a poison control center or emergency room at once. °NOTE: This medicine is only for you. Do not share this medicine with others. °What if I miss a dose? °If you miss a dose, take it as soon as you can unless it is more than 4 hours late. If it is more than 4 hours late, skip the missed dose. Take the next dose at the normal time. °What may interact with this medication? °Do not take this medication with any of the following: °Pimozide °Thioridazine °This medication may also interact with the following: °Beta blockers °Caffeine °Certain medications for mental health conditions °Cimetidine °Cyclosporine °Medications for fungal infections like fluconazole  and ketoconazole °Medications for irregular heartbeat like amiodarone, flecainide and propafenone °Rifampin °Warfarin °This list may not describe all possible interactions. Give your health care provider a list of all the medicines, herbs, non-prescription drugs, or dietary supplements you use. Also tell them if you smoke, drink alcohol, or use illegal drugs. Some items may interact with your medicine. °What should I watch for while using this medication? °Visit your care team for regular checks on your progress. You may need blood work while you are taking this medication. It may be some time before you see the benefit from this medication. °This medication may cause serious skin reactions. They can happen weeks to months after starting the medication. Contact your care team right away if you notice fevers or flu-like symptoms with a rash. The rash may be red or purple and then turn into blisters or peeling of the skin. Or, you might notice a red rash with swelling of the face, lips or lymph nodes in your neck or under your arms. °This medication can make you more sensitive to the sun. Keep out of the sun, If you cannot avoid being in the sun, wear protective clothing and sunscreen. Do not use sun lamps or tanning beds/booths. °What side effects may I notice from receiving this medication? °Side effects that you should report to your care team as soon as possible: °Allergic reactions--skin rash, itching, hives, swelling of the face, lips, tongue, or throat °Change in sense of smell °Change in taste °Infection--fever, chills, cough, or sore throat °Liver injury--right upper belly pain, loss of appetite, nausea,   light-colored stool, dark yellow or brown urine, yellowing skin or eyes, unusual weakness or fatigue °Low red blood cell level--unusual weakness or fatigue, dizziness, headache, trouble breathing °Lupus-like syndrome--joint pain, swelling, or stiffness, butterfly-shaped rash on the face, rashes that get worse  in the sun, fever, unusual weakness or fatigue °Rash, fever, and swollen lymph nodes °Redness, blistering, peeling, or loosening of the skin, including inside the mouth °Unusual bruising or bleeding °Worsening mood, feelings of depression °Side effects that usually do not require medical attention (report to your care team if they continue or are bothersome): °Diarrhea °Gas °Headache °Nausea °Stomach pain °Upset stomach °This list may not describe all possible side effects. Call your doctor for medical advice about side effects. You may report side effects to FDA at 1-800-FDA-1088. °Where should I keep my medication? °Keep out of the reach of children and pets. °Store between 20 and 25 degrees C (68 and 77 degrees F). Protect from light. Get rid of any unused medication after the expiration date. °To get rid of medications that are no longer needed or have expired: °Take the medication to a medication take-back program. Check with your pharmacy or law enforcement to find a location. °If you cannot return the medication, check the label or package insert to see if the medication should be thrown out in the garbage or flushed down the toilet. If you are not sure, ask your care team. If it is safe to put it in the trash, take the medication out of the container. Mix the medication with cat litter, dirt, coffee grounds, or other unwanted substance. Seal the mixture in a bag or container. Put it in the trash. °NOTE: This sheet is a summary. It may not cover all possible information. If you have questions about this medicine, talk to your doctor, pharmacist, or health care provider. °© 2022 [employer]vier/[employer] Standard (2021-06-25 00:00:00) ° °

## 2021-12-11 NOTE — Progress Notes (Signed)
Subjective: 66 year old female presents the office today for follow-up evaluation of nail fungus.  She did notice a course of Lamisil previously which she did well with.  She is concerned that the nail fungus may be coming back.  No swelling redness or injury to the toenail sites that she has no other concerns today.   Objective: AAO x3, NAD DP/PT pulses palpable bilaterally, CRT less than 3 seconds Nails continue be hypertrophic, dystrophic with yellow-brown discoloration.  Still some clear on the proximal nail folds but overall nails appear to have significantly improved compared to last appointment.  No swelling or redness or drainage to the toenail sites.  No other tenderness. No pain with calf compression, swelling, warmth, erythema  Assessment: Onychomycosis  Plan: -All treatment options discussed with the patient including all alternatives, risks, complications.  This point we will do another form of Lamisil but we will recheck a CBC and LFT.  This was ordered today.  Once I get this back we will call the medication.  Discussed side effects.  Prescribe Jublia as well for topical use.   Vivi Barrack DPM

## 2021-12-12 ENCOUNTER — Other Ambulatory Visit: Payer: Self-pay | Admitting: Podiatry

## 2021-12-12 DIAGNOSIS — Z79899 Other long term (current) drug therapy: Secondary | ICD-10-CM

## 2021-12-12 LAB — CBC WITH DIFFERENTIAL/PLATELET
Absolute Monocytes: 342 cells/uL (ref 200–950)
Basophils Absolute: 60 cells/uL (ref 0–200)
Basophils Relative: 0.9 %
Eosinophils Absolute: 181 cells/uL (ref 15–500)
Eosinophils Relative: 2.7 %
HCT: 41.5 % (ref 35.0–45.0)
Hemoglobin: 13.9 g/dL (ref 11.7–15.5)
Lymphs Abs: 2593 cells/uL (ref 850–3900)
MCH: 30.8 pg (ref 27.0–33.0)
MCHC: 33.5 g/dL (ref 32.0–36.0)
MCV: 92 fL (ref 80.0–100.0)
MPV: 10.7 fL (ref 7.5–12.5)
Monocytes Relative: 5.1 %
Neutro Abs: 3524 cells/uL (ref 1500–7800)
Neutrophils Relative %: 52.6 %
Platelets: 217 10*3/uL (ref 140–400)
RBC: 4.51 10*6/uL (ref 3.80–5.10)
RDW: 12.8 % (ref 11.0–15.0)
Total Lymphocyte: 38.7 %
WBC: 6.7 10*3/uL (ref 3.8–10.8)

## 2021-12-12 LAB — HEPATIC FUNCTION PANEL
AG Ratio: 1.6 (calc) (ref 1.0–2.5)
ALT: 11 U/L (ref 6–29)
AST: 15 U/L (ref 10–35)
Albumin: 4.1 g/dL (ref 3.6–5.1)
Alkaline phosphatase (APISO): 51 U/L (ref 37–153)
Bilirubin, Direct: 0.1 mg/dL (ref 0.0–0.2)
Globulin: 2.6 g/dL (calc) (ref 1.9–3.7)
Indirect Bilirubin: 0.4 mg/dL (calc) (ref 0.2–1.2)
Total Bilirubin: 0.5 mg/dL (ref 0.2–1.2)
Total Protein: 6.7 g/dL (ref 6.1–8.1)

## 2021-12-12 MED ORDER — TERBINAFINE HCL 250 MG PO TABS: 250.0000 mg | ORAL_TABLET | Freq: Every day | ORAL | 0 refills | Status: DC

## 2022-01-08 ENCOUNTER — Encounter: Payer: Self-pay | Admitting: Podiatry

## 2022-01-13 ENCOUNTER — Other Ambulatory Visit: Payer: Self-pay

## 2022-01-13 ENCOUNTER — Ambulatory Visit
Admission: EM | Admit: 2022-01-13 | Discharge: 2022-01-13 | Disposition: A | Discharge status: Home / Self Care | Payer: Medicare PPO | Attending: Family Medicine | Admitting: Family Medicine

## 2022-01-13 DIAGNOSIS — R1032 Left lower quadrant pain: Secondary | ICD-10-CM

## 2022-01-13 DIAGNOSIS — R509 Fever, unspecified: Secondary | ICD-10-CM

## 2022-01-13 DIAGNOSIS — Z8719 Personal history of other diseases of the digestive system: Secondary | ICD-10-CM

## 2022-01-13 DIAGNOSIS — R197 Diarrhea, unspecified: Secondary | ICD-10-CM

## 2022-01-13 LAB — POCT URINALYSIS DIP (MANUAL ENTRY)
Bilirubin, UA: NEGATIVE
Blood, UA: NEGATIVE
Glucose, UA: NEGATIVE mg/dL
Ketones, POC UA: NEGATIVE mg/dL
Nitrite, UA: NEGATIVE
Protein Ur, POC: NEGATIVE mg/dL
Spec Grav, UA: 1.02 (ref 1.010–1.025)
Urobilinogen, UA: 0.2 E.U./dL
pH, UA: 7.5 (ref 5.0–8.0)

## 2022-01-13 MED ORDER — METRONIDAZOLE 500 MG PO TABS: 500.0000 mg | ORAL_TABLET | Freq: Three times a day (TID) | ORAL | 0 refills | Status: DC

## 2022-01-13 MED ORDER — FLUCONAZOLE 150 MG PO TABS: 150.0000 mg | ORAL_TABLET | ORAL | 0 refills | Status: DC

## 2022-01-13 MED ORDER — CIPROFLOXACIN HCL 500 MG PO TABS: 500.0000 mg | ORAL_TABLET | Freq: Two times a day (BID) | ORAL | 0 refills | Status: DC

## 2022-01-13 NOTE — ED Triage Notes (Addendum)
Pt reports "I think Im having a diverticulitis flare-up". Pt reports constant pain under left breast and generalized abdomen and lower back for last several days. Pt also reports diarrhea x1, emesis, low grade fever, chills, urinary frequency, bloating, fatigue.   Home covid test negative. Recently increased fiber intake.  Pt reports is receive abx then will also need a diflucan px. Pt reports "I always get a yeast infection when I take antibiotics."

## 2022-01-16 NOTE — ED Provider Notes (Signed)
[facility]    CSN: [id number] Arrival date & time: 01/13/22  1703      History   Chief Complaint Chief Complaint  Patient presents with   Abdominal Pain    HPI Kristen [name] is a 66 y.o. female.   Presenting today with several day history of progressively worsening left lower quadrant abdominal pain, diarrhea, low-grade fever, chills, urinary frequency, bloating, nausea, vomiting, fatigue.  She denies intolerance to p.o., bloody stools, recent travel, new medications.  Took a COVID test at home that was negative.  States she has a history of diverticulitis and this feels very similar, thinks this is a diverticulitis flare.  Not trying anything over-the-counter for symptoms thus far.   Past Medical History:  Diagnosis Date   Abnormal Pap smear of cervix 2005   colpo bx and conization - normal since   Depression    H/O syncope    Hypertension    Syncope and collapse 12/14/2008   echo EF 55-60% normal    Patient Active Problem List   Diagnosis Date Noted   Decreased estrogen level 05/13/2021   Impaired fasting glucose 05/13/2021   Major depression, single episode 05/13/2021   Vitamin D deficiency 05/13/2021   Diverticulitis 03/23/2021   Essential hypertension 07/03/2014   Depression    H/O syncope     Past Surgical History:  Procedure Laterality Date   BREAST SURGERY Right 1983   benign biopsy   DILATION AND CURETTAGE OF UTERUS  1980   OTHER SURGICAL HISTORY     benign lumpectomy left    OB History     Gravida  1   Para  1   Term      Preterm      AB      Living  1      SAB      IAB      Ectopic      Multiple      Live Births               Home Medications    Prior to Admission medications   Medication Sig Start Date End Date Taking? Authorizing Provider  ciprofloxacin (CIPRO) 500 MG tablet Take 1 tablet (500 mg total) by mouth 2 (two) times daily. 01/13/22  Yes Particia Nearing, PA-C  fluconazole (DIFLUCAN)  150 MG tablet Take 1 tablet (150 mg total) by mouth every other day. 01/13/22  Yes Particia Nearing, PA-C  metroNIDAZOLE (FLAGYL) 500 MG tablet Take 1 tablet (500 mg total) by mouth 3 (three) times daily. 01/13/22  Yes Particia Nearing, PA-C  aspirin 81 MG tablet Take 81 mg by mouth daily.    [provider]  benazepril (LOTENSIN) 20 MG tablet 1/2 TABLET ORALLY ONCE A DAY 90 DAYS    [provider]  benazepril-hydrochlorthiazide (LOTENSIN HCT) 20-12.5 MG per tablet Take 1 tablet by mouth daily.    [provider]  buPROPion (WELLBUTRIN XL) 300 MG 24 hr tablet 1 tablet in the morning    [provider]  cholecalciferol (VITAMIN D) 1000 UNITS tablet Take 1,000 Units by mouth daily.    [provider]  Cyanocobalamin (VITAMIN B 12 PO) Take by mouth.    [provider]  Efinaconazole 10 % SOLN Apply 1 drop topically daily. 09/08/21   Vivi Barrack, DPM  Efinaconazole 10 % SOLN Apply 1 drop topically daily. 12/11/21   Vivi Barrack, DPM  ergocalciferol (VITAMIN D2) 1.25 MG (  50000 UT) capsule Take by mouth.    [provider]  fluconazole (DIFLUCAN) 150 MG tablet Take 1 tablet (150 mg total) by mouth once. Take one tablet.  Repeat in 48 hours if symptoms are not completely resolved. 10/23/14   Ria Comment, FNP  FLUoxetine (PROZAC) 20 MG capsule TAKE 3 CAPSULES DAILY AS DIRECTED 09/04/15   Ria Comment, FNP  FLUoxetine (PROZAC) 20 MG capsule Take 1 capsule by mouth daily. 04/07/12   [provider]  FLUoxetine (PROZAC) 40 MG capsule Take by mouth. 07/01/21   [provider]  folic acid (FOLVITE) 1 MG tablet Take 1 mg by mouth daily.    [provider]  Multiple Vitamin (MULTIVITAMIN) tablet Take 1 tablet by mouth daily.    [provider]  nitrofurantoin, macrocrystal-monohydrate, (MACROBID) 100 MG capsule Take 1 capsule (100 mg total) by mouth 2 (two) times daily. 10/23/14   Ria Comment, FNP  NON FORMULARY Lemon Balm 12 drops a day    [provider]  Omega 3 1000 MG CAPS 1 capsule    [provider]  Red Yeast Rice Extract (RED YEAST RICE PO) Take by mouth.    [provider]  terbinafine (LAMISIL) 250 MG tablet Take 1 tablet (250 mg total) by mouth daily. 07/03/21   Vivi Barrack, DPM  terbinafine (LAMISIL) 250 MG tablet Take 1 tablet (250 mg total) by mouth daily. 12/12/21   Vivi Barrack, DPM    Family History Family History  Problem Relation Age of Onset   Heart disease Mother    Arrhythmia Mother        a fib   Breast cancer Mother 28   Heart attack Father 69   Stroke Father    Cancer Maternal Grandmother        colon    Social History Social History   Tobacco Use   Smoking status: Never    Passive exposure: Yes   Smokeless tobacco: Never  Substance Use Topics   Alcohol use: Yes    Comment: 1-2 times a month   Drug use: No     Allergies   Patient has no known allergies.   Review of Systems Review of Systems Per HPI  Physical Exam Triage Vital Signs ED Triage Vitals  Enc Vitals Group     BP 01/13/22 1854 (!) 153/83     Pulse Rate 01/13/22 1854 63     Resp 01/13/22 1854 18     Temp 01/13/22 1854 (!) 97.3 F (36.3 C)     Temp Source 01/13/22 1854 Oral     SpO2 01/13/22 1854 96 %     Weight 01/13/22 1809 178 lb (80.7 kg)     Height 01/13/22 1809 5\' 5"  (1.651 m)     Head Circumference --      Peak Flow --      Pain Score 01/13/22 1809 5     Pain Loc --      Pain Edu? --      Excl. in GC? --    No data found.  Updated Vital Signs BP (!) 153/83 (BP Location: Right Arm)    Pulse 63    Temp (!) 97.3 F (36.3 C) (Oral)    Resp 18    Ht 5\' 5"  (1.651 m)    Wt 178 lb (80.7 kg)    LMP 11/24/2003    SpO2 96%    BMI 29.62 kg/m   Visual Acuity Right Eye Distance:  Left Eye Distance:   Bilateral Distance:    Right Eye Near:   Left Eye Near:    Bilateral Near:     Physical Exam Vitals and  nursing note reviewed.  Constitutional:      Appearance: Normal appearance. She is not ill-appearing.  HENT:     Head: Atraumatic.  Eyes:     Extraocular Movements: Extraocular movements intact.     Conjunctiva/sclera: Conjunctivae normal.  Cardiovascular:     Rate and Rhythm: Normal rate and regular rhythm.     Heart sounds: Normal heart sounds.  Pulmonary:     Effort: Pulmonary effort is normal.     Breath sounds: Normal breath sounds.  Abdominal:     General: Bowel sounds are normal. There is no distension.     Palpations: Abdomen is soft.     Tenderness: There is abdominal tenderness. There is no right CVA tenderness, left CVA tenderness or guarding.     Comments: Left lower quadrant tenderness to palpation without distention, guarding, rebound.  Musculoskeletal:        General: Normal range of motion.     Cervical back: Normal range of motion and neck supple.  Skin:    General: Skin is warm and dry.  Neurological:     Mental Status: She is alert and oriented to person, place, and time.  Psychiatric:        Mood and Affect: Mood normal.        Thought Content: Thought content normal.        Judgment: Judgment normal.     UC Treatments / Results  Labs (all labs ordered are listed, but only abnormal results are displayed) Labs Reviewed  POCT URINALYSIS DIP (MANUAL ENTRY) - Abnormal; Notable for the following components:      Result Value   Leukocytes, UA Trace (*)    All other components within normal limits    EKG   Radiology No results found.  Procedures Procedures (including critical care time)  Medications Ordered in UC Medications - No data to display  Initial Impression / Assessment and Plan / UC Course  I have reviewed the triage vital signs and the nursing notes.  Pertinent labs & imaging results that were available during my care of the patient were reviewed by me and considered in my medical decision making (see chart for details).     Mildly  hypertensive in triage, otherwise vital signs within normal limits and reassuring.  UA with only trace leukocytes, urine culture pending.  Possibly diverticulitis flare, discussed that we are unable to confirm this as we do not have abdominal imaging in this setting.  Offered blood work but she prefers to try treatment and follow-up at the emergency department if symptoms are worsening for further evaluation.  We will treat with Cipro, Flagyl and Diflucan sent as she states she typically gets yeast infections with antibiotic usage.  Discussed brat diet, push fluids, strict return precautions for any worsening symptoms.  Final Clinical Impressions(s) / UC Diagnoses   Final diagnoses:  LLQ pain  Fever, unspecified  Diarrhea, unspecified type  History of diverticulitis   Discharge Instructions   None    ED Prescriptions     Medication Sig Dispense Auth. Provider   ciprofloxacin (CIPRO) 500 MG tablet Take 1 tablet (500 mg total) by mouth 2 (two) times daily. 14 tablet Particia NearingLane, Heidi Lemay [place], [place]-C   metroNIDAZOLE (FLAGYL) 500 MG tablet Take 1 tablet (500 mg total) by mouth 3 (three) times  daily. 21 tablet Particia Nearing, [place]   fluconazole (DIFLUCAN) 150 MG tablet Take 1 tablet (150 mg total) by mouth every other day. 3 tablet Particia Nearing, [place]      PDMP not reviewed this encounter.   Particia Nearing, [place] 01/16/22 1945

## 2022-01-29 ENCOUNTER — Other Ambulatory Visit (HOSPITAL_COMMUNITY): Payer: Self-pay | Admitting: Family Medicine

## 2022-01-29 ENCOUNTER — Other Ambulatory Visit: Payer: Self-pay | Admitting: Family Medicine

## 2022-01-29 DIAGNOSIS — E7800 Pure hypercholesterolemia, unspecified: Secondary | ICD-10-CM

## 2022-02-13 ENCOUNTER — Ambulatory Visit (HOSPITAL_BASED_OUTPATIENT_CLINIC_OR_DEPARTMENT_OTHER)
Admission: RE | Admit: 2022-02-13 | Discharge: 2022-02-13 | Disposition: A | Discharge status: Home / Self Care | Payer: Medicare PPO | Source: Ambulatory Visit | Attending: Family Medicine | Admitting: Family Medicine

## 2022-02-13 ENCOUNTER — Other Ambulatory Visit: Payer: Self-pay

## 2022-02-13 DIAGNOSIS — E7800 Pure hypercholesterolemia, unspecified: Secondary | ICD-10-CM | POA: Insufficient documentation

## 2022-03-17 ENCOUNTER — Ambulatory Visit: Payer: Medicare PPO | Admitting: Cardiology

## 2022-03-18 ENCOUNTER — Ambulatory Visit: Payer: Medicare PPO | Admitting: Interventional Cardiology

## 2022-03-18 ENCOUNTER — Encounter: Payer: Self-pay | Admitting: *Deleted

## 2022-03-18 ENCOUNTER — Encounter: Payer: Self-pay | Admitting: Interventional Cardiology

## 2022-03-18 VITALS — BP 110/62 | HR 68 | Ht 65.0 in | Wt 172.0 lb

## 2022-03-18 DIAGNOSIS — R931 Abnormal findings on diagnostic imaging of heart and coronary circulation: Secondary | ICD-10-CM

## 2022-03-18 DIAGNOSIS — E782 Mixed hyperlipidemia: Secondary | ICD-10-CM

## 2022-03-18 DIAGNOSIS — R0609 Other forms of dyspnea: Secondary | ICD-10-CM | POA: Diagnosis not present

## 2022-03-18 NOTE — Progress Notes (Signed)
?  ?Cardiology Office Note ? ? ?Date:  03/18/2022  ? ?ID:  Kristen [name], DOB [birth year], MRN [id number] ? ?PCP:  Mila Palmer, MD  ? ? ?No chief complaint on file. ? ?Coronary artery calcification ? ?Wt Readings from Last 3 Encounters:  ?03/18/22 172 lb (78 kg)  ?01/13/22 178 lb (80.7 kg)  ?10/23/14 174 lb (78.9 kg)  ?  ? ?  ?History of Present Illness: ?Kristen [name] is a 66 y.o. female who is being seen today for the evaluation of coronary artery calcification at the request of Mila Palmer, MD. ? ?She is the daughter of Rosalia Hammers and Briant Sites who were both my patients. ? ?Calcium scoring CT showed: ?"Left main: 0 ?  ?Left anterior descending artery: 833 ?  ?Left circumflex artery: 106 ?  ?Right coronary artery: 87.3 ?  ?Total: 1026 ?  ?Percentile: 98 ?  ?Pericardium: Normal. ?  ?Ascending Aorta: Normal caliber." ? ? ?She has had fatigue.  She had COVID twice.  She lost her job at that time. She does pet sit.  ? ?Denies : Chest pain. Dizziness. Leg edema. Nitroglycerin use. Orthopnea. Palpitations. Paroxysmal nocturnal dyspnea.  Syncope.   ? ?She tries to eat healthy.   ? ?Smoked for 20 years.  Quit in 2015. ? ? ? ? ? ?Past Medical History:  ?Diagnosis Date  ? Abnormal Pap smear of cervix 2005  ? colpo bx and conization - normal since  ? Depression   ? H/O syncope   ? Hypertension   ? Syncope and collapse 12/14/2008  ? echo EF 55-60% normal  ? ? ?Past Surgical History:  ?Procedure Laterality Date  ? BREAST SURGERY Right 1983  ? benign biopsy  ? DILATION AND CURETTAGE OF UTERUS  1980  ? OTHER SURGICAL HISTORY    ? benign lumpectomy left  ? ? ? ?Current Outpatient Medications  ?Medication Sig Dispense Refill  ? benazepril-hydrochlorthiazide (LOTENSIN HCT) 20-12.5 MG per tablet Take 1 tablet by mouth daily.    ? buPROPion (WELLBUTRIN XL) 300 MG 24 hr tablet 1 tablet in the morning    ? Coenzyme Q10-Red Yeast Rice 60-600 MG CAPS Take 1 capsule by mouth at bedtime.    ? FLUoxetine (PROZAC) 40 MG capsule Take  by mouth.    ? Misc Natural Products (BUTCHERS BROOM PO) Take by mouth. Take 2 tablets at bedtime    ? NON FORMULARY Guggul take 3 tabs at bed time ?Perfect eyes take 2 tabs daily ?Phytomulti Take 1 tablet once a day ?Probiotic take 2 tabs on an empty stomach ?Omegazyme take 1 tablet three times a day with a meal ?Elderberry defense take 2 tabs in the morning and 1 at lunch  ?Raw calcium take 2 tabs at lunch and at tabs at dinner ?Adaptocrine Take 1 tab daily ?Nattozimes 3 tabs daily on an empty stomach (takes in place of aspirin)    ? terbinafine (LAMISIL) 250 MG tablet Take 1 tablet (250 mg total) by mouth daily. 90 tablet 0  ? ?No current facility-administered medications for this visit.  ? ? ?Allergies:   Propoxyphene  ? ? ?Social History:  The patient  reports that she has never smoked. She has been exposed to tobacco smoke. She has never used smokeless tobacco. She reports current alcohol use. She reports that she does not use drugs.  ? ?Family History:  The patient's family history includes Arrhythmia in her mother; Breast cancer (age of onset: 72) in her mother; Cancer in her  maternal grandmother; Heart attack (age of onset: 64) in her father; Heart disease in her mother; Stroke in her father.  ? ? ?ROS:  Please see the history of present illness.   Otherwise, review of systems are positive for fatigue.   All other systems are reviewed and negative.  ? ? ?PHYSICAL EXAM: ?VS:  BP 110/62   Pulse 68   Ht 5\' 5"  (1.651 m)   Wt 172 lb (78 kg)   LMP 11/24/2003   SpO2 98%   BMI 28.62 kg/m?  , BMI Body mass index is 28.62 kg/m?. ?GEN: Well nourished, well developed, in no acute distress ?HEENT: normal ?Neck: no JVD, carotid bruits, or masses ?Cardiac: RRR; no murmurs, rubs, or gallops,no edema  ?Respiratory:  clear to auscultation bilaterally, normal work of breathing ?GI: soft, nontender, nondistended, + BS ?MS: no deformity or atrophy ?Skin: warm and dry, no rash ?Neuro:  Strength and sensation are  intact ?Psych: euthymic mood, full affect ? ? ?EKG:   ?The ekg ordered today demonstrates NSR, septal Q waves ? ? ?Recent Labs: ?12/11/2021: ALT 11; Hemoglobin 13.9; Platelets 217  ? ?Lipid Panel ?   ?Component Value Date/Time  ? CHOL 221 (H) 04/05/2013 1409  ? TRIG 109 04/05/2013 1409  ? HDL 57 04/05/2013 1409  ? CHOLHDL 3.9 04/05/2013 1409  ? VLDL 22 04/05/2013 1409  ? LDLCALC 142 (H) 04/05/2013 1409  ? ?  ?Other studies Reviewed: ?Additional studies/ records that were reviewed today with results demonstrating: LDL 107, HDL 63, triglycerides 63 in March 2023. ? ? ?ASSESSMENT AND PLAN: ? ?Coronary calcification: 98 percentile.  High calcium score.  Given her dyspnea on exertion, need to rule out ischemia as a source.  We will plan for exercise Myoview.  Would not do CTA as there may be blooming artifact.  Given the high calcium score, statin therapy is indicated.  We will see the results of her stress test and discuss further.  After the stress test, would consider starting rosuvastatin 20 mg daily.  With her high calcium score, would like to see her LDL closer to 70. ?HTN: The current medical regimen is effective;  continue present plan and medications.  We discussed low-salt diet. ?Family h/o CAD: Father with CAD.  He had PCI and severe calcific coronary disease requiring atherectomy.  We spoke about preventative therapy like regular exercise and healthy diet.  ? ? ?Current medicines are reviewed at length with the patient today.  The patient concerns regarding her medicines were addressed. ? ?The following changes have been made:  No change ? ?Labs/ tests ordered today include:  ?No orders of the defined types were placed in this encounter. ? ? ?Recommend 150 minutes/week of aerobic exercise ?Low fat, low carb, high fiber diet recommended ? ?Disposition:   FU  for stress test ? ? ?Signed, ?April 2023, MD  ?03/18/2022 3:18 PM    ?[facility] ?[address] [place], [place], [place]   [place] ?Phone: [phone]; Fax: [phone]  ? ?

## 2022-03-18 NOTE — Patient Instructions (Signed)
Medication Instructions:  ?Your physician recommends that you continue on your current medications as directed. Please refer to the Current Medication list given to you today. ? ?*If you need a refill on your cardiac medications before your next appointment, please call your pharmacy* ? ? ?Lab Work: ?none ?If you have labs (blood work) drawn today and your tests are completely normal, you will receive your results only by: ?MyChart Message (if you have MyChart) OR ?A paper copy in the mail ?If you have any lab test that is abnormal or we need to change your treatment, we will call you to review the results. ? ? ?Testing/Procedures: ?Your physician has requested that you have an exercise stress myoview. For further information please visit [email or web address] Please follow instruction sheet, as given.  ? ? ?Follow-Up: ?At [facility], you and your health needs are our priority.  As part of our continuing mission to provide you with exceptional heart care, we have created designated Provider Care Teams.  These Care Teams include your primary Cardiologist (physician) and Advanced Practice Providers (APPs -  Physician Assistants and Nurse Practitioners) who all work together to provide you with the care you need, when you need it. ? ?We recommend signing up for the patient portal called "MyChart".  Sign up information is provided on this After Visit Summary.  MyChart is used to connect with patients for Virtual Visits (Telemedicine).  Patients are able to view lab/test results, encounter notes, upcoming appointments, etc.  Non-urgent messages can be sent to your provider as well.   ?To learn more about what you can do with MyChart, go to [email or web address].   ? ?Your next appointment:   ?12 month(s) ? ?The format for your next appointment:   ?In Person ? ?Provider:   ?Lance Muss, MD   ? ? ?Other Instructions ?  ? ?Important Information About Sugar ? ? ? ? ? ? ?

## 2022-03-24 ENCOUNTER — Telehealth (HOSPITAL_COMMUNITY): Payer: Self-pay

## 2022-03-24 NOTE — Telephone Encounter (Signed)
Detailed instructions left on the patient's answering machine. Asked to call back with any questions. S.Bettyjean Stefanski EMTP 

## 2022-03-25 ENCOUNTER — Telehealth (HOSPITAL_COMMUNITY): Payer: Self-pay | Admitting: Interventional Cardiology

## 2022-03-25 NOTE — Telephone Encounter (Signed)
Patient called and cancelled Myoview for reason below: ? ?03/25/2022 8:29 AM [name], Kristen A  ?Cancel Rsn: Patient (not ready to have appt) ? ?Order will be removed from the [facility].  ?

## 2022-03-26 ENCOUNTER — Ambulatory Visit (HOSPITAL_COMMUNITY): Payer: Medicare PPO

## 2022-03-30 ENCOUNTER — Encounter: Payer: Self-pay | Admitting: Interventional Cardiology

## 2022-03-30 DIAGNOSIS — R0609 Other forms of dyspnea: Secondary | ICD-10-CM

## 2022-03-30 DIAGNOSIS — R931 Abnormal findings on diagnostic imaging of heart and coronary circulation: Secondary | ICD-10-CM

## 2022-03-31 NOTE — Telephone Encounter (Signed)
Reviewed with Dr Eldridge Dace and he would like patient to have cardiac PET Scan ?

## 2022-04-09 ENCOUNTER — Ambulatory Visit: Payer: Medicare PPO | Admitting: Podiatry

## 2022-04-09 DIAGNOSIS — B351 Tinea unguium: Secondary | ICD-10-CM | POA: Diagnosis not present

## 2022-04-09 MED ORDER — TAVABOROLE 5 % EX SOLN: 1.0000 [drp] | Freq: Every day | CUTANEOUS | 2 refills | Status: DC

## 2022-04-09 NOTE — Patient Instructions (Signed)
Tavaborole topical solution What is this medication? TAVABOROLE (ta va BO role) is an antifungal medicine. It is used to treat certain kinds of fungal infections of the toenail. This medicine may be used for other purposes; ask your health care provider or pharmacist if you have questions. COMMON BRAND NAME(S): KERYDIN What should I tell my care team before I take this medication? They need to know if you have any of these conditions: an unusual or allergic reaction to tavaborole, other medicines, foods, dyes or preservatives pregnant or trying to get pregnant breast-feeding How should I use this medication? This medicine is for external use only. Do not take by mouth. Follow the directions on the label. Wash hands before and after use. Apply this medicine as directed to cover the entire toenail. Do not use your medicine more often than directed. Finish the full course prescribed by your doctor or health care professional even if you think your condition is better. Do not stop using except on the advice of your doctor or health care professional. Talk to your pediatrician regarding the use of this medicine in children. While this drug may be prescribed for children as young as 6 years for selected conditions, precautions do apply. Overdosage: If you think you have taken too much of this medicine contact a poison control center or emergency room at once. NOTE: This medicine is only for you. Do not share this medicine with others. What if I miss a dose? If you miss a dose, use it as soon as you can. If it is almost time for your next dose, use only that dose. Do not use double or extra doses. What may interact with this medication? Interactions have not been studied. Do not use any other nail products (i.e., nail polish, pedicures) during treatment with this medicine. This list may not describe all possible interactions. Give your health care provider a list of all the medicines, herbs,  non-prescription drugs, or dietary supplements you use. Also tell them if you smoke, drink alcohol, or use illegal drugs. Some items may interact with your medicine. What should I watch for while using this medication? Do not get this medicine in your eyes. If you do, rinse out with plenty of cool tap water. Tell your doctor or healthcare professional if your symptoms do not start to get better or if they get worse. After bathing make sure that your feet are very dry. Fungal infections like moist conditions. Do not walk around barefoot. To help prevent reinfection, wear freshly washed cotton, not synthetic, clothing. Tell your doctor or health care professional if you develop sores or blisters that do not heal properly. If your nail infection returns after you stop using this medicine, contact your doctor or health care professional. What side effects may I notice from receiving this medication? Side effects that you should report to your doctor or health care professional as soon as possible: allergic reactions like skin rash, itching or hives, swelling of the face, lips, or tongue ingrown toenail Side effects that usually do not require medical attention (report to your doctor or health care professional if they continue or are bothersome): redness or irritation at application site mild skin irritation This list may not describe all possible side effects. Call your doctor for medical advice about side effects. You may report side effects to FDA at 1-800-FDA-1088. Where should I keep my medication? Keep out of the reach of children. Store at room temperature between 20 and 25 degrees C (68  and 77 degrees F). Keep this medicine in the original container. Throw away any unused medicine after the expiration date, or within 3 month after insertion of the dropper. This medicine is flammable. Avoid exposure to heat, fire, flame, and smoking. NOTE: This sheet is a summary. It may not cover all possible  information. If you have questions about this medicine, talk to your doctor, pharmacist, or health care provider.  2023 Elsevier/Gold Standard (2017-06-24 00:00:00)

## 2022-04-12 DIAGNOSIS — B351 Tinea unguium: Secondary | ICD-10-CM | POA: Insufficient documentation

## 2022-04-12 NOTE — Progress Notes (Signed)
Subjective: 66 year old female presents the office today for follow-up evaluation of nail fungus.  She did not get the Jublia due to cost.  She previously was on Lamisil and she is currently not applying any medication to her nails.  The nails are thick and elongated and she has difficulty trimming herself.  Ink color to make doing a little bit better.  No drainage or pus.  No open lesions.  No other concerns.   Objective: AAO x3, NAD DP/PT pulses palpable bilaterally, CRT less than 3 seconds Nails continue be hypertrophic, dystrophic with yellow-brown discoloration.  Some mild clear on the proximal nail fold but overall appears to be mostly unchanged.  No significant pain to the nails today.  No open lesions. No pain with calf compression, swelling, warmth, erythema  Assessment: Onychomycosis  Plan: -All treatment options discussed with the patient including all alternatives, risks, complications.  -As a courtesy debride the nails x10 without any complications or bleeding -We discussed other treatment options with oral and topical.Continue with topical medication for now.  I have prescribed Jodi Geralds I found a coupon online for her.  If not able to get this will do Penlac. Reviewed blood work that was done on Apr 10, 2022.  AST 15, ALT 12, creatinine 0.88

## 2022-04-16 IMAGING — CT CT CARDIAC CORONARY ARTERY CALCIUM SCORE
3 series · 13 of 20 positions shown, 15 images · non-contrast
Comparison: None.
COMPARISON: None.

Addendum:
EXAM:
OVER-READ INTERPRETATION  CT CHEST

The following report is an over-read performed by radiologist Dr.
Fumeiro Veiga [REDACTED] on 02/13/2022. This
over-read does not include interpretation of cardiac or coronary
anatomy or pathology. The coronary calcium score interpretation by
the cardiologist is attached.
TECHNIQUE: A gated, non-contrast computed tomography scan of the heart was
performed using 3mm slice thickness. Axial images were analyzed on a
dedicated workstation. Calcium scoring of the coronary arteries was
performed using the Agatston method.

[Series 2: ax lung · axial · 0.88mm/px · z∈[-287,-175]mm · 5 of 86 slices shown]
[im 15/86  lung]
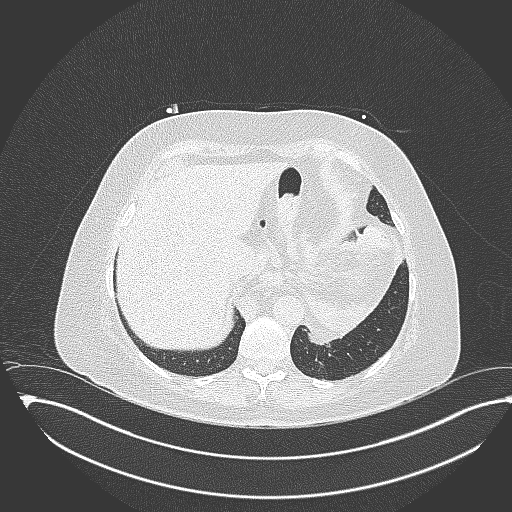
[im 29/86  lung]
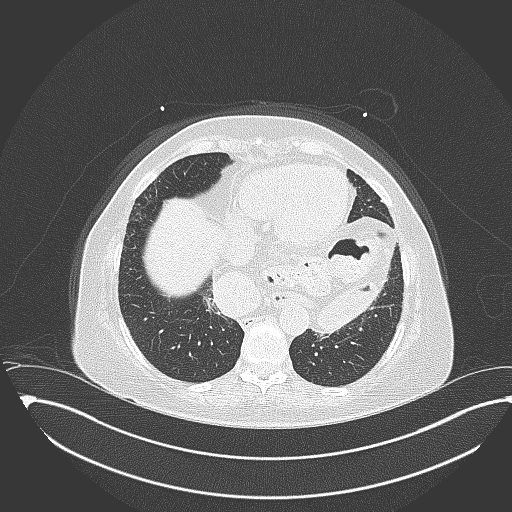
[im 43/86  lung]
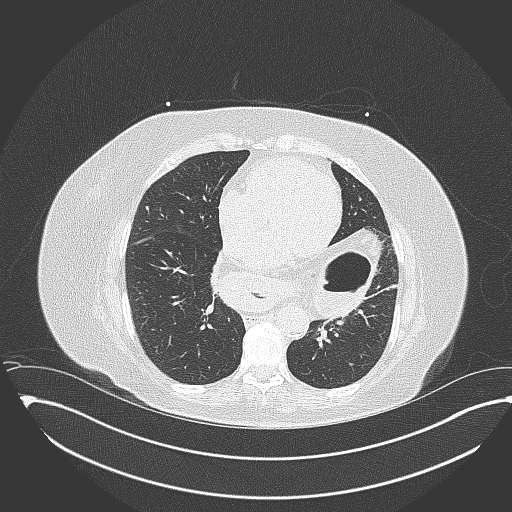
[im 57/86  lung]
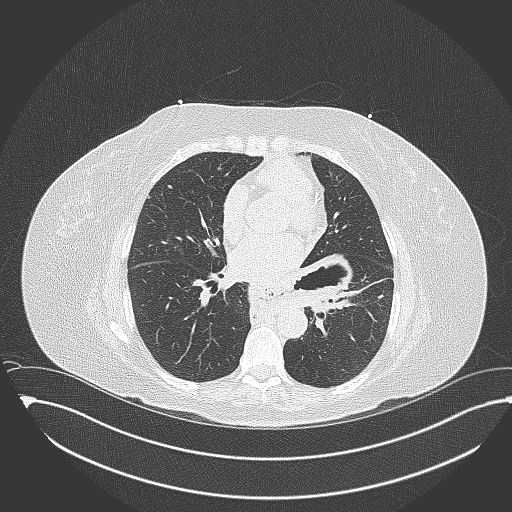
[im 71/86  lung]
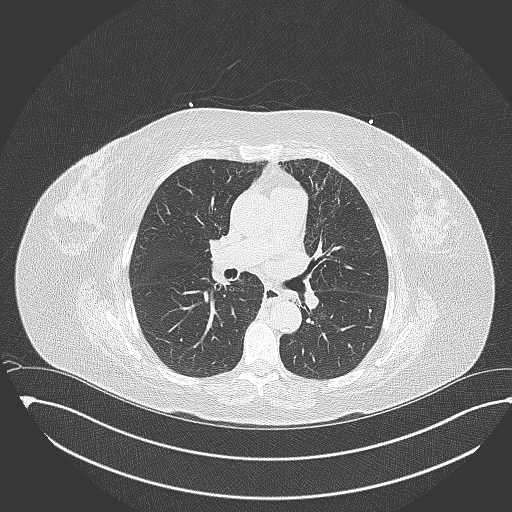

[Series 3: cascseq 3.0 sa36 70% (id) · axial · 0.38mm/px · z∈[-271,-229]mm · 2 of 57 slices shown]
[im 15/57  vessel]
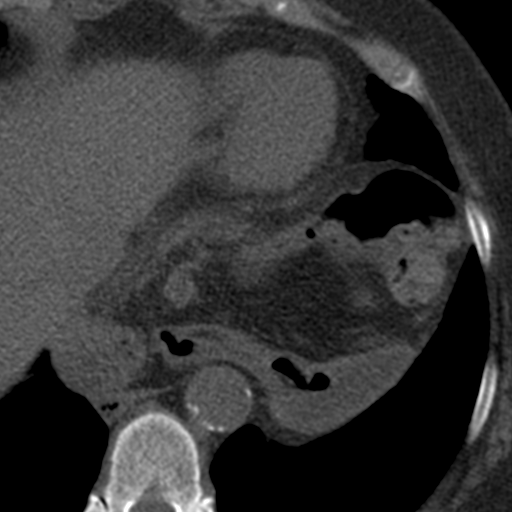
[im 29/57  vessel]
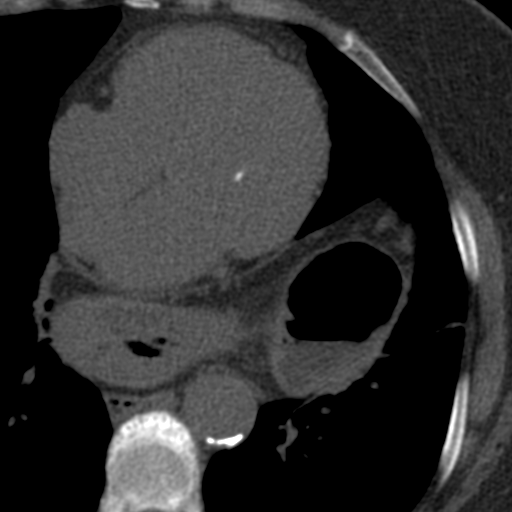

[Series 4: ax st · axial · 0.61mm/px · z∈[-291,-171]mm · 6 of 86 slices shown, 8 images]
[im 13/86  vessel]
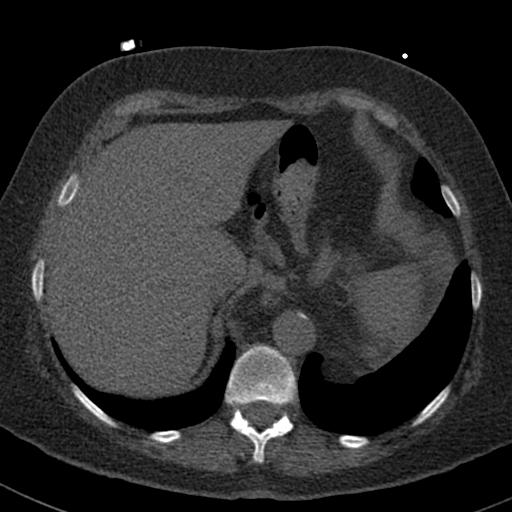
[im 13/86  lung]
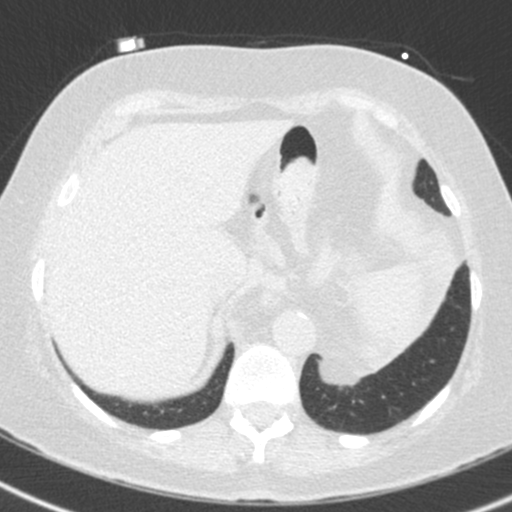
[im 25/86  vessel]
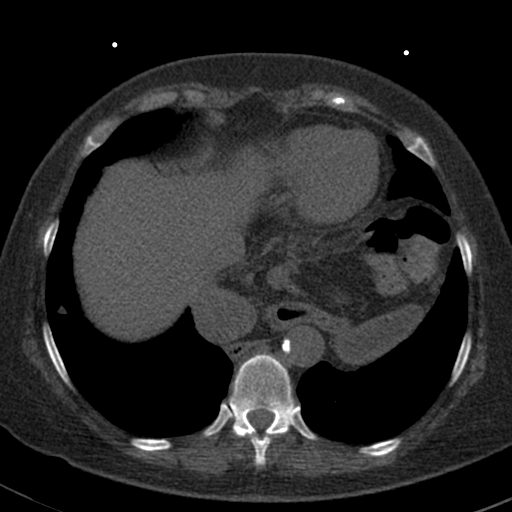
[im 37/86  vessel]
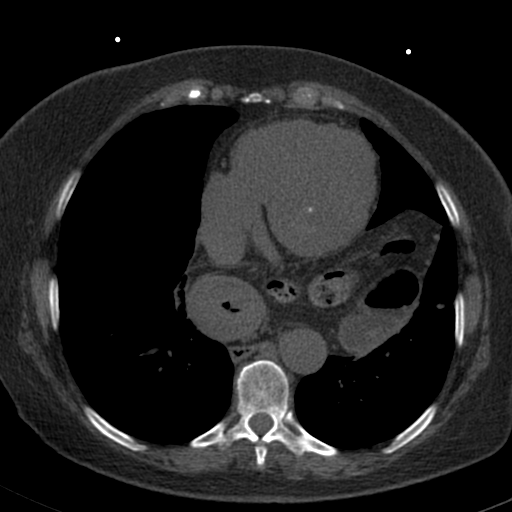
[im 49/86  vessel]
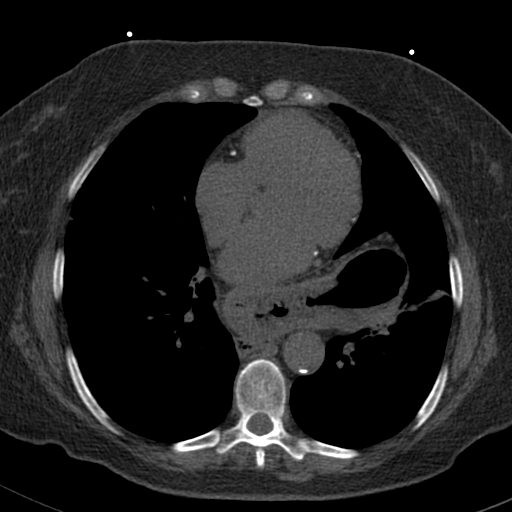
[im 61/86  vessel]
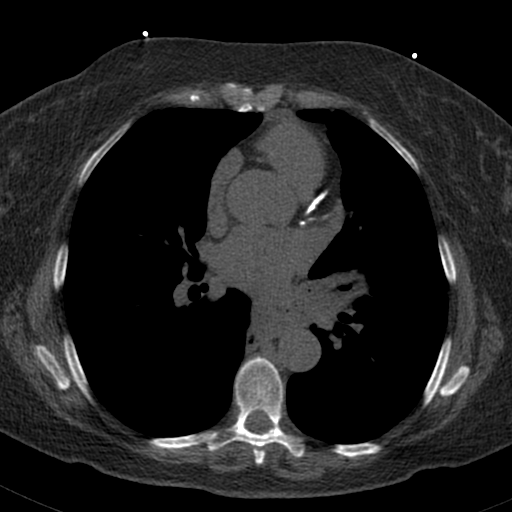
[im 61/86  lung]
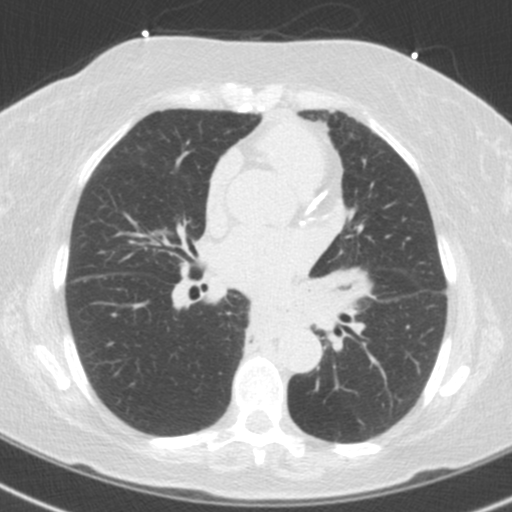
[im 73/86  vessel]
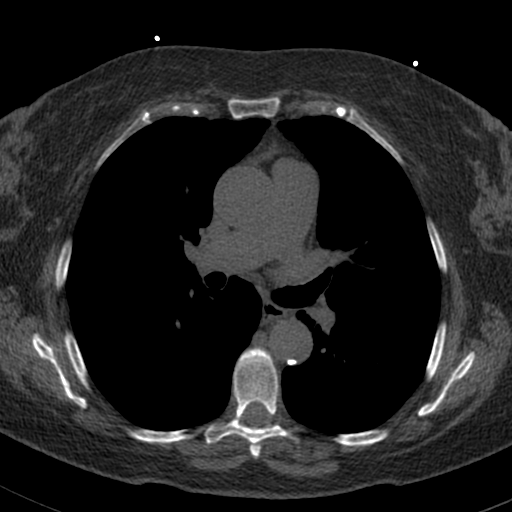

[13 of 20 positions shown; findings below may reference images not displayed]

FINDINGS: Atherosclerotic calcifications in the thoracic aorta. Multiple
pulmonary nodules scattered throughout the visualize lungs, largest
of which measures up to 7 mm in the periphery of the left lower lobe
(axial image 55 of series 2). Within the visualized portions of the
thorax there are no suspicious appearing pulmonary nodules or
masses, there is no acute consolidative airspace disease, no pleural
effusions, no pneumothorax and no lymphadenopathy. Large hiatal
hernia containing the entire stomach and a portion of the transverse
colon. Visualized portions of the upper abdomen are otherwise
unremarkable. There are no aggressive appearing lytic or blastic
lesions noted in the visualized portions of the skeleton.
IMPRESSION: 1.  Aortic Atherosclerosis ([id number]-3CU.U).
2. Large hiatal hernia.
3. Multiple pulmonary nodules scattered throughout the visualize
lungs measuring up to 7 mm in the left lower lobe. The possibility
of metastatic disease should be considered. Non-contrast chest CT at
3-6 months is recommended. If the nodules are stable at time of
repeat CT, then future CT at 18-24 months (from today's scan) is
considered optional for low-risk patients, but is recommended for
high-risk patients. This recommendation follows the consensus
statement: Guidelines for Management of Incidental Pulmonary Nodules
Detected on CT Images: From the [HOSPITAL] 2269; Radiology

ADDENDUM:
Cardiovascular Disease Risk stratification

EXAM:
Coronary Calcium Score
FINDINGS: Coronary arteries: Normal origins.

Coronary Calcium Score:

Left main: 0

Left anterior descending artery: 833

Left circumflex artery: 106

Right coronary artery:

Total: 8389

Percentile: 98

Pericardium: Normal.

Ascending Aorta: Normal caliber.

Non-cardiac: See separate report from [REDACTED].
IMPRESSION: Coronary calcium score of 8389. This was 98 percentile for age-,
race-, and sex-matched controls.



If CAC=0, it is reasonable to withhold statin therapy and reassess
in 5 to 10 years, as long as higher risk conditions are absent
(diabetes mellitus, family history of premature CHD in first degree
relatives (males <55 years; females <65 years), cigarette smoking,
or LDL >=190 mg/dL).

If CAC is 1 to 99, it is reasonable to initiate statin therapy for
patients >=55 years of age.

If CAC is >=100 or >=75th percentile, it is reasonable to initiate
statin therapy at any age.

Cardiology referral should be considered for patients with CAC
scores >=400 or >=75th percentile.

*0263 AHA/ACC/AACVPR/AAPA/ABC/DEVALOIR/VITELA/LEMZ/Moriya/MOOLMAN/PADO/SLOBODAN-DANCE
Guideline on the Management of Blood Cholesterol: A Report of the
American College of Cardiology/American Heart Association Task Force
on Clinical Practice Guidelines. J Am Coll Cardiol.
1075;73(24):4809-4933.

G-N Hutanu, DO

*** End of Addendum ***
EXAM:
OVER-READ INTERPRETATION  CT CHEST

The following report is an over-read performed by radiologist Dr.
Fumeiro Veiga [REDACTED] on 02/13/2022. This
over-read does not include interpretation of cardiac or coronary
anatomy or pathology. The coronary calcium score interpretation by
the cardiologist is attached.
FINDINGS: Atherosclerotic calcifications in the thoracic aorta. Multiple
pulmonary nodules scattered throughout the visualize lungs, largest
of which measures up to 7 mm in the periphery of the left lower lobe
(axial image 55 of series 2). Within the visualized portions of the
thorax there are no suspicious appearing pulmonary nodules or
masses, there is no acute consolidative airspace disease, no pleural
effusions, no pneumothorax and no lymphadenopathy. Large hiatal
hernia containing the entire stomach and a portion of the transverse
colon. Visualized portions of the upper abdomen are otherwise
unremarkable. There are no aggressive appearing lytic or blastic
lesions noted in the visualized portions of the skeleton.
IMPRESSION: 1.  Aortic Atherosclerosis ([id number]-3CU.U).
2. Large hiatal hernia.
3. Multiple pulmonary nodules scattered throughout the visualize
lungs measuring up to 7 mm in the left lower lobe. The possibility
of metastatic disease should be considered. Non-contrast chest CT at
3-6 months is recommended. If the nodules are stable at time of
repeat CT, then future CT at 18-24 months (from today's scan) is
considered optional for low-risk patients, but is recommended for
high-risk patients. This recommendation follows the consensus
statement: Guidelines for Management of Incidental Pulmonary Nodules
Detected on CT Images: From the [HOSPITAL] 2269; Radiology

## 2022-04-23 ENCOUNTER — Encounter: Payer: Self-pay | Admitting: Podiatry

## 2022-05-18 DIAGNOSIS — Z0389 Encounter for observation for other suspected diseases and conditions ruled out: Secondary | ICD-10-CM | POA: Diagnosis not present

## 2022-05-18 DIAGNOSIS — H905 Unspecified sensorineural hearing loss: Secondary | ICD-10-CM | POA: Diagnosis not present

## 2022-05-20 ENCOUNTER — Encounter: Payer: Self-pay | Admitting: Interventional Cardiology

## 2022-05-21 ENCOUNTER — Encounter: Payer: Self-pay | Admitting: Interventional Cardiology

## 2022-06-07 ENCOUNTER — Encounter: Payer: Self-pay | Admitting: Interventional Cardiology

## 2022-06-08 ENCOUNTER — Telehealth (HOSPITAL_COMMUNITY): Payer: Self-pay | Admitting: *Deleted

## 2022-06-08 NOTE — Telephone Encounter (Signed)
Reaching out to patient to offer assistance regarding upcoming cardiac imaging study; pt verbalizes understanding of appt date/time, parking situation and where to check in, pre-test NPO status  and verified current allergies; name and call back number provided for further questions should they arise  Mihira Tozzi RN Navigator Cardiac Imaging [facility] [phone] office [phone] cell  Patient aware to avoid caffeine 12 hours prior to her cardiac PET scan. 

## 2022-06-09 ENCOUNTER — Ambulatory Visit (HOSPITAL_COMMUNITY)
Admission: RE | Admit: 2022-06-09 | Discharge: 2022-06-09 | Disposition: A | Discharge status: Home / Self Care | Payer: Medicare PPO | Source: Ambulatory Visit | Attending: Interventional Cardiology | Admitting: Interventional Cardiology

## 2022-06-09 DIAGNOSIS — R0609 Other forms of dyspnea: Secondary | ICD-10-CM | POA: Insufficient documentation

## 2022-06-09 DIAGNOSIS — R931 Abnormal findings on diagnostic imaging of heart and coronary circulation: Secondary | ICD-10-CM | POA: Insufficient documentation

## 2022-06-09 MED ORDER — REGADENOSON 0.4 MG/5ML IV SOLN
INTRAVENOUS | Status: AC
  Administered 2022-06-09: 0.4 mg via INTRAVENOUS
  Filled 2022-06-09: qty 5

## 2022-06-09 MED ORDER — RUBIDIUM RB82 GENERATOR (RUBYFILL)
25.0000 | PACK | Freq: Once | INTRAVENOUS | Status: AC
  Administered 2022-06-09: 20 via INTRAVENOUS

## 2022-06-09 MED ORDER — RUBIDIUM RB82 GENERATOR (RUBYFILL)
25.0000 | PACK | Freq: Once | INTRAVENOUS | Status: AC
  Administered 2022-06-09: 20.4 via INTRAVENOUS

## 2022-06-10 LAB — NM PET CT CARDIAC PERFUSION MULTI W/ABSOLUTE BLOODFLOW
LV dias vol: 85 mL (ref 46–106)
LV sys vol: 25 mL
MBFR: 2.34
Nuc Rest EF: 68 %
Nuc Stress EF: 71 %
Peak HR: 70 {beats}/min
Rest HR: 52 {beats}/min
Rest MBF: 0.83 ml/g/min
Rest Nuclear Isotope Dose: 20 mCi
Rest perfusion cavity size (mL): 75 mL
Stress MBF: 1.94 ml/g/min
Stress Nuclear Isotope Dose: 20.4 mCi
Stress perfusion cavity size (mL): 85 mL
TID: 1.02

## 2022-07-09 ENCOUNTER — Telehealth: Payer: Self-pay

## 2022-07-09 NOTE — Telephone Encounter (Signed)
Tavaborole (KERYDIN) 5 % SOLN  Prior authorization has been started today on 07/09/22.

## 2022-07-10 NOTE — Telephone Encounter (Signed)
PA was denied today 07/10/22.

## 2022-07-14 MED ORDER — CICLOPIROX 8 % EX SOLN: Freq: Every day | CUTANEOUS | 2 refills | Status: DC

## 2022-07-14 NOTE — Telephone Encounter (Signed)
Due to the denial I have sent Penlac to the pharmacy for her. This was sent to her regular pharmacy.

## 2022-07-14 NOTE — Addendum Note (Signed)
Addended by: Ovid Curd R on: 07/14/2022 06:30 PM   Modules accepted: Orders

## 2022-07-22 DIAGNOSIS — H25813 Combined forms of age-related cataract, bilateral: Secondary | ICD-10-CM | POA: Diagnosis not present

## 2022-07-22 DIAGNOSIS — H353131 Nonexudative age-related macular degeneration, bilateral, early dry stage: Secondary | ICD-10-CM | POA: Diagnosis not present

## 2022-08-27 ENCOUNTER — Other Ambulatory Visit: Payer: Self-pay | Admitting: Family Medicine

## 2022-08-27 ENCOUNTER — Other Ambulatory Visit (HOSPITAL_COMMUNITY)
Admission: RE | Admit: 2022-08-27 | Discharge: 2022-08-27 | Disposition: A | Discharge status: Home / Self Care | Payer: Medicare PPO | Source: Ambulatory Visit | Attending: Family Medicine | Admitting: Family Medicine

## 2022-08-27 DIAGNOSIS — K219 Gastro-esophageal reflux disease without esophagitis: Secondary | ICD-10-CM | POA: Diagnosis not present

## 2022-08-27 DIAGNOSIS — I2510 Atherosclerotic heart disease of native coronary artery without angina pectoris: Secondary | ICD-10-CM | POA: Diagnosis not present

## 2022-08-27 DIAGNOSIS — R918 Other nonspecific abnormal finding of lung field: Secondary | ICD-10-CM | POA: Diagnosis not present

## 2022-08-27 DIAGNOSIS — Z01411 Encounter for gynecological examination (general) (routine) with abnormal findings: Secondary | ICD-10-CM | POA: Insufficient documentation

## 2022-08-27 DIAGNOSIS — Z1151 Encounter for screening for human papillomavirus (HPV): Secondary | ICD-10-CM | POA: Insufficient documentation

## 2022-08-27 DIAGNOSIS — Z0000 Encounter for general adult medical examination without abnormal findings: Secondary | ICD-10-CM | POA: Diagnosis not present

## 2022-08-27 DIAGNOSIS — K2940 Chronic atrophic gastritis without bleeding: Secondary | ICD-10-CM | POA: Diagnosis not present

## 2022-08-27 DIAGNOSIS — I10 Essential (primary) hypertension: Secondary | ICD-10-CM | POA: Diagnosis not present

## 2022-08-27 DIAGNOSIS — R896 Abnormal cytological findings in specimens from other organs, systems and tissues: Secondary | ICD-10-CM | POA: Diagnosis not present

## 2022-08-27 DIAGNOSIS — Z79899 Other long term (current) drug therapy: Secondary | ICD-10-CM | POA: Diagnosis not present

## 2022-08-27 DIAGNOSIS — E559 Vitamin D deficiency, unspecified: Secondary | ICD-10-CM | POA: Diagnosis not present

## 2022-09-02 LAB — CYTOLOGY - PAP
Comment: NEGATIVE
Diagnosis: NEGATIVE
High risk HPV: NEGATIVE

## 2022-10-21 DIAGNOSIS — Z1231 Encounter for screening mammogram for malignant neoplasm of breast: Secondary | ICD-10-CM | POA: Diagnosis not present

## 2022-10-21 DIAGNOSIS — Z780 Asymptomatic menopausal state: Secondary | ICD-10-CM | POA: Diagnosis not present

## 2022-10-21 DIAGNOSIS — M8589 Other specified disorders of bone density and structure, multiple sites: Secondary | ICD-10-CM | POA: Diagnosis not present

## 2023-03-04 DIAGNOSIS — Z7982 Long term (current) use of aspirin: Secondary | ICD-10-CM | POA: Diagnosis not present

## 2023-03-04 DIAGNOSIS — Z79899 Other long term (current) drug therapy: Secondary | ICD-10-CM | POA: Diagnosis not present

## 2023-03-04 DIAGNOSIS — H903 Sensorineural hearing loss, bilateral: Secondary | ICD-10-CM | POA: Diagnosis not present

## 2023-03-04 DIAGNOSIS — H9313 Tinnitus, bilateral: Secondary | ICD-10-CM | POA: Diagnosis not present

## 2023-03-04 DIAGNOSIS — Z0110 Encounter for examination of ears and hearing without abnormal findings: Secondary | ICD-10-CM | POA: Diagnosis not present

## 2023-03-04 DIAGNOSIS — Z8673 Personal history of transient ischemic attack (TIA), and cerebral infarction without residual deficits: Secondary | ICD-10-CM | POA: Diagnosis not present

## 2023-03-08 ENCOUNTER — Encounter: Payer: Self-pay | Admitting: Interventional Cardiology

## 2023-03-11 ENCOUNTER — Other Ambulatory Visit: Payer: Self-pay | Admitting: Family Medicine

## 2023-03-11 DIAGNOSIS — R911 Solitary pulmonary nodule: Secondary | ICD-10-CM

## 2023-04-06 ENCOUNTER — Ambulatory Visit
Admission: RE | Admit: 2023-04-06 | Discharge: 2023-04-06 | Disposition: A | Discharge status: Home / Self Care | Payer: Medicare PPO | Source: Ambulatory Visit | Attending: Family Medicine | Admitting: Family Medicine

## 2023-04-06 DIAGNOSIS — R911 Solitary pulmonary nodule: Secondary | ICD-10-CM | POA: Diagnosis not present

## 2023-04-06 DIAGNOSIS — I700 Atherosclerosis of aorta: Secondary | ICD-10-CM | POA: Diagnosis not present

## 2023-05-05 DIAGNOSIS — I700 Atherosclerosis of aorta: Secondary | ICD-10-CM | POA: Diagnosis not present

## 2023-05-05 DIAGNOSIS — F330 Major depressive disorder, recurrent, mild: Secondary | ICD-10-CM | POA: Diagnosis not present

## 2023-05-05 DIAGNOSIS — I2510 Atherosclerotic heart disease of native coronary artery without angina pectoris: Secondary | ICD-10-CM | POA: Diagnosis not present

## 2023-05-05 DIAGNOSIS — R918 Other nonspecific abnormal finding of lung field: Secondary | ICD-10-CM | POA: Diagnosis not present

## 2023-05-05 DIAGNOSIS — K449 Diaphragmatic hernia without obstruction or gangrene: Secondary | ICD-10-CM | POA: Diagnosis not present

## 2023-05-13 ENCOUNTER — Encounter: Payer: Self-pay | Admitting: Interventional Cardiology

## 2023-06-11 ENCOUNTER — Ambulatory Visit: Payer: Medicare PPO | Admitting: Pulmonary Disease

## 2023-06-11 ENCOUNTER — Encounter: Payer: Self-pay | Admitting: Pulmonary Disease

## 2023-06-11 VITALS — BP 120/70 | HR 57 | Ht 63.5 in | Wt 160.2 lb

## 2023-06-11 DIAGNOSIS — K449 Diaphragmatic hernia without obstruction or gangrene: Secondary | ICD-10-CM | POA: Diagnosis not present

## 2023-06-11 DIAGNOSIS — R911 Solitary pulmonary nodule: Secondary | ICD-10-CM | POA: Diagnosis not present

## 2023-06-11 NOTE — Progress Notes (Signed)
Synopsis: Referred in July 2024 for lung nodule by Mila Palmer, MD  Subjective:   PATIENT ID: Kristen [name] GENDER: female DOB: January 31, 1956, MRN: [id number]  Chief Complaint  Patient presents with   Consult    Consult on lung nodule.    67 yo FM, PMH HTN, depression. Former smoker. Quit in 2015, started when she was 67 yo. Has had a known large hiatal hernia. Routinely has cough. Was seen for follow up of lung nodules. Ct imaging with stable BL lower lobe lung nodules. No prior PFTs. Was married to a smoker, and grew up with parents who smoked. No prior history of cancer.     Past Medical History:  Diagnosis Date   Abnormal Pap smear of cervix 2005   colpo bx and conization - normal since   Depression    H/O syncope    Hypertension    Syncope and collapse 12/14/2008   echo EF 55-60% normal     Family History  Problem Relation Age of Onset   Heart disease Mother    Arrhythmia Mother        a fib   Breast cancer Mother 81   Heart attack Father 67   Stroke Father    Cancer Maternal Grandmother        colon     Past Surgical History:  Procedure Laterality Date   BREAST SURGERY Right 1983   benign biopsy   DILATION AND CURETTAGE OF UTERUS  1980   OTHER SURGICAL HISTORY     benign lumpectomy left    Social History   Socioeconomic History   Marital status: Widowed    Spouse name: Not on file   Number of children: Not on file   Years of education: Not on file   Highest education level: Not on file  Occupational History   Not on file  Tobacco Use   Smoking status: Never    Passive exposure: Yes   Smokeless tobacco: Never  Substance and Sexual Activity   Alcohol use: Yes    Comment: 1-2 times a month   Drug use: No   Sexual activity: Not on file  Other Topics Concern   Not on file  Social History Narrative   Not on file   Social Determinants of Health   Financial Resource Strain: Not on file  Food Insecurity: Not on file  Transportation Needs:  Not on file  Physical Activity: Not on file  Stress: Not on file  Social Connections: Not on file  Intimate Partner Violence: Not on file     Allergies  Allergen Reactions   Propoxyphene Swelling     Outpatient Medications Prior to Visit  Medication Sig Dispense Refill   benazepril-hydrochlorthiazide (LOTENSIN HCT) 20-12.5 MG per tablet Take 1 tablet by mouth daily.     buPROPion (WELLBUTRIN XL) 300 MG 24 hr tablet 1 tablet in the morning     cholecalciferol (VITAMIN D3) 25 MCG (1000 UNIT) tablet Take 1,000 Units by mouth daily.     ciclopirox (PENLAC) 8 % solution Apply topically at bedtime. Apply over nail and surrounding skin. Apply daily over previous coat. After seven (7) days, may remove with alcohol and continue cycle. 6.6 mL 2   Coenzyme Q10 100 MG capsule Take 3 capsules by mouth daily.     Coenzyme Q10-Red Yeast Rice 60-600 MG CAPS Take 1 capsule by mouth at bedtime.     FLUoxetine (PROZAC) 40 MG capsule Take by mouth.  Misc Natural Products (BUTCHERS BROOM PO) Take by mouth. Take 2 tablets at bedtime     NON FORMULARY Guggul take 3 tabs at bed time Perfect eyes take 2 tabs daily Phytomulti Take 1 tablet once a day Probiotic take 2 tabs on an empty stomach Omegazyme take 1 tablet three times a day with a meal Elderberry defense take 2 tabs in the morning and 1 at lunch  Raw calcium take 2 tabs at lunch and at tabs at dinner Adaptocrine Take 1 tab daily Nattozimes 3 tabs daily on an empty stomach (takes in place of aspirin)     Tavaborole (KERYDIN) 5 % SOLN Apply 1 drop topically daily. Apply 1 drop to the toenail daily. 10 mL 2   terbinafine (LAMISIL) 250 MG tablet Take 1 tablet (250 mg total) by mouth daily. 90 tablet 0   No facility-administered medications prior to visit.    Review of Systems  Constitutional:  Negative for chills, fever, malaise/fatigue and weight loss.  HENT:  Negative for hearing loss, sore throat and tinnitus.   Eyes:  Negative for blurred  vision and double vision.  Respiratory:  Negative for cough, hemoptysis, sputum production, shortness of breath, wheezing and stridor.   Cardiovascular:  Negative for chest pain, palpitations, orthopnea, leg swelling and PND.  Gastrointestinal:  Negative for abdominal pain, constipation, diarrhea, heartburn, nausea and vomiting.  Genitourinary:  Negative for dysuria, hematuria and urgency.  Musculoskeletal:  Negative for joint pain and myalgias.  Skin:  Negative for itching and rash.  Neurological:  Negative for dizziness, tingling, weakness and headaches.  Endo/Heme/Allergies:  Negative for environmental allergies. Does not bruise/bleed easily.  Psychiatric/Behavioral:  Negative for depression. The patient is not nervous/anxious and does not have insomnia.   All other systems reviewed and are negative.    Objective:  Physical Exam Vitals reviewed.  Constitutional:      General: She is not in acute distress.    Appearance: She is well-developed.  HENT:     Head: Normocephalic and atraumatic.  Eyes:     General: No scleral icterus.    Conjunctiva/sclera: Conjunctivae normal.     Pupils: Pupils are equal, round, and reactive to light.  Neck:     Vascular: No JVD.     Trachea: No tracheal deviation.  Cardiovascular:     Rate and Rhythm: Normal rate and regular rhythm.     Heart sounds: Normal heart sounds. No murmur heard. Pulmonary:     Effort: Pulmonary effort is normal. No tachypnea, accessory muscle usage or respiratory distress.     Breath sounds: Normal breath sounds. No stridor. No wheezing, rhonchi or rales.  Abdominal:     General: Bowel sounds are normal. There is no distension.     Palpations: Abdomen is soft.     Tenderness: There is no abdominal tenderness.  Musculoskeletal:        General: No tenderness.     Cervical back: Neck supple.  Lymphadenopathy:     Cervical: No cervical adenopathy.  Skin:    General: Skin is warm and dry.     Capillary Refill:  Capillary refill takes less than 2 seconds.     Findings: No rash.  Neurological:     Mental Status: She is alert and oriented to person, place, and time.  Psychiatric:        Behavior: Behavior normal.      Vitals:   06/11/23 1010  BP: 120/70  Pulse: (!) 57  SpO2: 100%  Weight:  160 lb 3.2 oz (72.7 kg)  Height: 5' 3.5" (1.613 m)   100% on RA BMI Readings from Last 3 Encounters:  06/11/23 27.93 kg/m  03/18/22 28.62 kg/m  01/13/22 29.62 kg/m   Wt Readings from Last 3 Encounters:  06/11/23 160 lb 3.2 oz (72.7 kg)  03/18/22 172 lb (78 kg)  01/13/22 178 lb (80.7 kg)     CBC    Component Value Date/Time   WBC 6.7 12/11/2021 1111   RBC 4.51 12/11/2021 1111   HGB 13.9 12/11/2021 1111   HCT 41.5 12/11/2021 1111   PLT 217 12/11/2021 1111   MCV 92.0 12/11/2021 1111   MCH 30.8 12/11/2021 1111   MCHC 33.5 12/11/2021 1111   RDW 12.8 12/11/2021 1111   LYMPHSABS 2,593 12/11/2021 1111   MONOABS 0.5 08/12/2010 1543   EOSABS 181 12/11/2021 1111   BASOSABS 60 12/11/2021 1111     Chest Imaging: May 2024 - stable ct chest, BL lung nodules stable. Large hiatal hernia  The patient's images have been independently reviewed by me.    Pulmonary Functions Testing Results:     No data to display          FeNO:   Pathology:   Echocardiogram:   Heart Catheterization:     Assessment & Plan:     ICD-10-CM   1. Lung nodule  R91.1 Ambulatory referral to Cardiothoracic Surgery    Pulmonary Function Test    CT Chest Wo Contrast    2. Hiatal hernia  K44.9 CT Chest Wo Contrast      Discussion:  67 yo FM, Bilateral lung nodules, former smoker, second hand smoke exposure. Large hiatal hernia  P: Follow up ct chest in 6 months for nodules  PFTs prior to surgery eval  Referral to thoracic surgery to see HL to discuss hernia  RTC in 6 months to see [place] after ct chest. Can see SG, NP after ct     Current Outpatient Medications:    benazepril-hydrochlorthiazide  (LOTENSIN HCT) 20-12.5 MG per tablet, Take 1 tablet by mouth daily., Disp: , Rfl:    buPROPion (WELLBUTRIN XL) 300 MG 24 hr tablet, 1 tablet in the morning, Disp: , Rfl:    cholecalciferol (VITAMIN D3) 25 MCG (1000 UNIT) tablet, Take 1,000 Units by mouth daily., Disp: , Rfl:    ciclopirox (PENLAC) 8 % solution, Apply topically at bedtime. Apply over nail and surrounding skin. Apply daily over previous coat. After seven (7) days, may remove with alcohol and continue cycle., Disp: 6.6 mL, Rfl: 2   Coenzyme Q10 100 MG capsule, Take 3 capsules by mouth daily., Disp: , Rfl:    Coenzyme Q10-Red Yeast Rice 60-600 MG CAPS, Take 1 capsule by mouth at bedtime., Disp: , Rfl:    FLUoxetine (PROZAC) 40 MG capsule, Take by mouth., Disp: , Rfl:    Misc Natural Products (BUTCHERS BROOM PO), Take by mouth. Take 2 tablets at bedtime, Disp: , Rfl:    NON FORMULARY, Guggul take 3 tabs at bed time Perfect eyes take 2 tabs daily Phytomulti Take 1 tablet once a day Probiotic take 2 tabs on an empty stomach Omegazyme take 1 tablet three times a day with a meal Elderberry defense take 2 tabs in the morning and 1 at lunch  Raw calcium take 2 tabs at lunch and at tabs at dinner Adaptocrine Take 1 tab daily Nattozimes 3 tabs daily on an empty stomach (takes in place of aspirin), Disp: , Rfl:  Tavaborole (KERYDIN) 5 % SOLN, Apply 1 drop topically daily. Apply 1 drop to the toenail daily., Disp: 10 mL, Rfl: 2   terbinafine (LAMISIL) 250 MG tablet, Take 1 tablet (250 mg total) by mouth daily., Disp: 90 tablet, Rfl: 0    [name], DO [facility] 06/11/2023 10:35 AM

## 2023-06-11 NOTE — Patient Instructions (Signed)
Thank you for visiting Dr. Tonia Brooms at [facility]. Today we recommend the following:  Orders Placed This Encounter  Procedures   CT Chest Wo Contrast   Ambulatory referral to Cardiothoracic Surgery   Pulmonary Function Test   Return in about 6 months (around 12/12/2023) for with Kandice Robinsons, NP, or Dr. Tonia Brooms, after CT Chest.    Please do your part to reduce the spread of COVID-19.

## 2023-06-15 ENCOUNTER — Encounter: Payer: Self-pay | Admitting: Pulmonary Disease

## 2023-06-27 ENCOUNTER — Encounter: Payer: Self-pay | Admitting: Interventional Cardiology

## 2023-07-07 NOTE — Progress Notes (Signed)
[address]       [place] [address]             [phone]                    GRISELL [name] [facility] Medical Record #[id number] Date of Birth: 03/21/1956  Referring: [name], DO Primary Care: Mila Palmer, MD Primary Cardiologist: Lance Muss, MD  Chief Complaint:    Chief Complaint  Patient presents with   Hiatal Hernia    New patient consultation, chest CT 5/14, PFTs 7/26    History of Present Illness:    Kristen [name] 67 y.o. female presents for surgical evaluation a large type IV hiatal hernia.  She claims that she is minimally symptomatic, but on further questioning, she has made drastic changes to her diet and eating habits over the last several decades.  She eats regular food, but only very small portions.  She does have reflux with tomatoes, but avoids most foods.  She is able to lay flat without any major symptoms.  She denies any odynophagia.  She does have some dysphagia if she eats too fast.  She also has bouts of constipation and diarrhea.  She denies any shortness of breath or respiratory symptoms.       Zubrod Score: At the time of surgery this patient's most appropriate activity status/level should be described as: [x]     0    Normal activity, no symptoms []     1    Restricted in physical strenuous activity but ambulatory, able to do out light work []     2    Ambulatory and capable of self care, unable to do work activities, up and about               >50 % of waking hours                              []     3    Only limited self care, in bed greater than 50% of waking hours []     4    Completely disabled, no self care, confined to bed or chair []     5    Moribund   Past Medical History:  Diagnosis Date   Abnormal Pap smear of cervix 2005   colpo bx and conization - normal since   Depression    H/O syncope    Hypertension    Syncope and collapse 12/14/2008   echo EF 55-60% normal    Past Surgical  History:  Procedure Laterality Date   BREAST SURGERY Right 1983   benign biopsy   DILATION AND CURETTAGE OF UTERUS  1980   OTHER SURGICAL HISTORY     benign lumpectomy left    Family History  Problem Relation Age of Onset   Heart disease Mother    Arrhythmia Mother        a fib   Breast cancer Mother 72   Heart attack Father 17   Stroke Father    Cancer Maternal Grandmother        colon     Social History   Tobacco Use  Smoking Status Never   Passive exposure: Yes  Smokeless Tobacco Never    Social History   Substance and Sexual Activity  Alcohol Use Yes   Comment: 1-2 times a month     Allergies  Allergen Reactions   Propoxyphene Swelling    Current Outpatient Medications  Medication Sig Dispense Refill   benazepril-hydrochlorthiazide (LOTENSIN HCT) 20-12.5 MG per tablet Take 1 tablet by mouth daily.     buPROPion (WELLBUTRIN XL) 300 MG 24 hr tablet 1 tablet in the morning     cholecalciferol (VITAMIN D3) 25 MCG (1000 UNIT) tablet Take 1,000 Units by mouth daily.     ciclopirox (PENLAC) 8 % solution Apply topically at bedtime. Apply over nail and surrounding skin. Apply daily over previous coat. After seven (7) days, may remove with alcohol and continue cycle. 6.6 mL 2   Coenzyme Q10 100 MG capsule Take 3 capsules by mouth daily.     Coenzyme Q10-Red Yeast Rice 60-600 MG CAPS Take 1 capsule by mouth at bedtime.     FLUoxetine (PROZAC) 40 MG capsule Take by mouth.     Misc Natural Products (BUTCHERS BROOM PO) Take by mouth. Take 2 tablets at bedtime     NON FORMULARY Guggul take 3 tabs at bed time Perfect eyes take 2 tabs daily Phytomulti Take 1 tablet once a day Probiotic take 2 tabs on an empty stomach Omegazyme take 1 tablet three times a day with a meal Elderberry defense take 2 tabs in the morning and 1 at lunch  Raw calcium take 2 tabs at lunch and at tabs at dinner Adaptocrine Take 1 tab daily Nattozimes 3 tabs daily on an empty stomach (takes in  place of aspirin)     Tavaborole (KERYDIN) 5 % SOLN Apply 1 drop topically daily. Apply 1 drop to the toenail daily. 10 mL 2   terbinafine (LAMISIL) 250 MG tablet Take 1 tablet (250 mg total) by mouth daily. 90 tablet 0   No current facility-administered medications for this visit.    Review of Systems  Constitutional:  Negative for malaise/fatigue and weight loss.  Respiratory:  Negative for cough and shortness of breath.   Cardiovascular:  Negative for chest pain.  Neurological: Negative.   Psychiatric/Behavioral: Negative.       PHYSICAL EXAMINATION: BP (!) 159/70 (BP Location: Left Arm, Patient Position: Sitting, Cuff Size: Normal)   Pulse 62   Resp 20   Ht 5' 3.5" (1.613 m)   Wt 162 lb (73.5 kg)   LMP 11/24/2003   SpO2 98% Comment: RA  BMI 28.25 kg/m  Physical Exam Constitutional:      General: She is not in acute distress.    Appearance: She is not ill-appearing.  Eyes:     Extraocular Movements: Extraocular movements intact.  Cardiovascular:     Rate and Rhythm: Normal rate.  Pulmonary:     Effort: Pulmonary effort is normal. No respiratory distress.  Abdominal:     General: Abdomen is flat. There is distension.  Musculoskeletal:        General: No swelling.     Cervical back: Normal range of motion.  Skin:    General: Skin is warm and dry.  Neurological:     General: No focal deficit present.     Mental Status: She is alert and oriented to person, place, and time.     Diagnostic Studies & Laboratory data:    CT Scan:    EGD/EUS: none      I have independently reviewed the above radiology studies  and reviewed the findings with the patient.   Recent Lab Findings: Lab Results  Component Value Date   WBC 6.7 12/11/2021   HGB 13.9 12/11/2021  HCT 41.5 12/11/2021   PLT 217 12/11/2021   GLUCOSE 109 (H) 08/12/2010   CHOL 221 (H) 04/05/2013   TRIG 109 04/05/2013   HDL 57 04/05/2013   LDLCALC 142 (H) 04/05/2013   ALT 11 12/11/2021   AST 15  12/11/2021   NA 140 08/12/2010   K 3.9 08/12/2010   CL 106 08/12/2010   CREATININE 0.80 08/12/2010   BUN 16 08/12/2010   CO2 24 08/12/2010   INR 0.97 08/12/2010      Assessment / Plan:   67yo female with Type IV hiatal hernia with stomach and colon in the defect.  She has compensated well given the size.  She would still like to proceed with surgery, but would like to wait until later in the fall.  Given her lack of symptoms, I think that this is reasonable.  Warning signs for volvulus have been discussed.  She will follow-up in November.     I  spent 40 minutes with the patient face to face counseling and coordination of care.    [name] 07/12/2023 8:43 AM

## 2023-07-09 ENCOUNTER — Encounter: Payer: Self-pay | Admitting: Thoracic Surgery (Cardiothoracic Vascular Surgery)

## 2023-07-09 ENCOUNTER — Institutional Professional Consult (permissible substitution): Payer: Medicare PPO | Admitting: Thoracic Surgery (Cardiothoracic Vascular Surgery)

## 2023-07-09 ENCOUNTER — Other Ambulatory Visit: Payer: Self-pay | Admitting: *Deleted

## 2023-07-09 ENCOUNTER — Encounter: Payer: Self-pay | Admitting: *Deleted

## 2023-07-09 VITALS — BP 159/70 | HR 62 | Resp 20 | Ht 63.5 in | Wt 162.0 lb

## 2023-07-09 DIAGNOSIS — K449 Diaphragmatic hernia without obstruction or gangrene: Secondary | ICD-10-CM

## 2023-07-29 ENCOUNTER — Encounter: Payer: Self-pay | Admitting: Interventional Cardiology

## 2023-08-16 DIAGNOSIS — H25813 Combined forms of age-related cataract, bilateral: Secondary | ICD-10-CM | POA: Diagnosis not present

## 2023-08-16 DIAGNOSIS — H353131 Nonexudative age-related macular degeneration, bilateral, early dry stage: Secondary | ICD-10-CM | POA: Diagnosis not present

## 2023-09-15 DIAGNOSIS — E559 Vitamin D deficiency, unspecified: Secondary | ICD-10-CM | POA: Diagnosis not present

## 2023-09-15 DIAGNOSIS — Z79899 Other long term (current) drug therapy: Secondary | ICD-10-CM | POA: Diagnosis not present

## 2023-09-15 DIAGNOSIS — Z0000 Encounter for general adult medical examination without abnormal findings: Secondary | ICD-10-CM | POA: Diagnosis not present

## 2023-09-15 DIAGNOSIS — I2510 Atherosclerotic heart disease of native coronary artery without angina pectoris: Secondary | ICD-10-CM | POA: Diagnosis not present

## 2023-09-15 DIAGNOSIS — F329 Major depressive disorder, single episode, unspecified: Secondary | ICD-10-CM | POA: Diagnosis not present

## 2023-09-15 DIAGNOSIS — H90A21 Sensorineural hearing loss, unilateral, right ear, with restricted hearing on the contralateral side: Secondary | ICD-10-CM | POA: Diagnosis not present

## 2023-09-15 DIAGNOSIS — R946 Abnormal results of thyroid function studies: Secondary | ICD-10-CM | POA: Diagnosis not present

## 2023-09-15 DIAGNOSIS — K449 Diaphragmatic hernia without obstruction or gangrene: Secondary | ICD-10-CM | POA: Diagnosis not present

## 2023-09-15 LAB — LAB REPORT - SCANNED: EGFR: 86

## 2023-09-27 ENCOUNTER — Ambulatory Visit (HOSPITAL_BASED_OUTPATIENT_CLINIC_OR_DEPARTMENT_OTHER): Payer: Medicare PPO

## 2023-10-04 ENCOUNTER — Ambulatory Visit (HOSPITAL_BASED_OUTPATIENT_CLINIC_OR_DEPARTMENT_OTHER)
Admission: RE | Admit: 2023-10-04 | Discharge: 2023-10-04 | Disposition: A | Discharge status: Home / Self Care | Payer: Medicare PPO | Source: Ambulatory Visit | Attending: Pulmonary Disease | Admitting: Pulmonary Disease

## 2023-10-04 DIAGNOSIS — R911 Solitary pulmonary nodule: Secondary | ICD-10-CM | POA: Insufficient documentation

## 2023-10-04 DIAGNOSIS — I700 Atherosclerosis of aorta: Secondary | ICD-10-CM | POA: Diagnosis not present

## 2023-10-04 DIAGNOSIS — K449 Diaphragmatic hernia without obstruction or gangrene: Secondary | ICD-10-CM | POA: Insufficient documentation

## 2023-10-04 DIAGNOSIS — R918 Other nonspecific abnormal finding of lung field: Secondary | ICD-10-CM | POA: Diagnosis not present

## 2023-10-04 DIAGNOSIS — E041 Nontoxic single thyroid nodule: Secondary | ICD-10-CM | POA: Diagnosis not present

## 2023-10-04 DIAGNOSIS — J9811 Atelectasis: Secondary | ICD-10-CM | POA: Diagnosis not present

## 2023-10-08 ENCOUNTER — Ambulatory Visit: Payer: Medicare PPO | Admitting: Thoracic Surgery (Cardiothoracic Vascular Surgery)

## 2023-10-08 ENCOUNTER — Encounter: Payer: Self-pay | Admitting: Thoracic Surgery (Cardiothoracic Vascular Surgery)

## 2023-10-08 VITALS — BP 148/85 | HR 61 | Resp 20 | Ht 63.5 in | Wt 160.8 lb

## 2023-10-08 DIAGNOSIS — Z9889 Other specified postprocedural states: Secondary | ICD-10-CM | POA: Diagnosis not present

## 2023-10-08 DIAGNOSIS — Z8719 Personal history of other diseases of the digestive system: Secondary | ICD-10-CM

## 2023-10-08 NOTE — Progress Notes (Signed)
[address]       [place] [address]             [phone]                                                   [name] [facility] Medical Record #[id number] Date of Birth: 13-Aug-1956   Referring: [name], DO Primary Care: Mila Palmer, MD Primary Cardiologist: Lance Muss, MD   Chief Complaint:        Chief Complaint  Patient presents with   Hiatal Hernia      New patient consultation, chest CT 5/14, PFTs 7/26      History of Present Illness:    Kristen [name] 67 y.o. female presents for surgical evaluation a large type IV hiatal hernia.  She claims that she is minimally symptomatic, but on further questioning, she has made drastic changes to her diet and eating habits over the last several decades.  She eats regular food, but only very small portions.  She does have reflux with tomatoes, but avoids most foods.  She is able to lay flat without any major symptoms.  She denies any odynophagia.  She does have some dysphagia if she eats too fast.  She also has bouts of constipation and diarrhea.  She denies any shortness of breath or respiratory symptoms.     She comes today for follow-up.  She has no new symptoms or complaints.     Zubrod Score: At the time of surgery this patient's most appropriate activity status/level should be described as: [x]     0    Normal activity, no symptoms []     1    Restricted in physical strenuous activity but ambulatory, able to do out light work []     2    Ambulatory and capable of self care, unable to do work activities, up and about               >50 % of waking hours                              []     3    Only limited self care, in bed greater than 50% of waking hours []     4    Completely disabled, no self care, confined to bed or chair []     5    Moribund         Past Medical History:  Diagnosis Date   Abnormal Pap smear of cervix 2005    colpo bx and conization - normal since    Depression     H/O syncope     Hypertension     Syncope and collapse 12/14/2008    echo EF 55-60% normal               Past Surgical History:  Procedure Laterality Date   BREAST SURGERY Right 1983    benign biopsy   DILATION AND CURETTAGE OF UTERUS   1980   OTHER SURGICAL HISTORY        benign lumpectomy left               Family History  Problem Relation Age of Onset   Heart disease Mother  Arrhythmia Mother          a fib   Breast cancer Mother 87   Heart attack Father 57   Stroke Father     Cancer Maternal Grandmother          colon            Tobacco Use History  Social History        Tobacco Use  Smoking Status Never   Passive exposure: Yes  Smokeless Tobacco Never      Social History        Substance and Sexual Activity  Alcohol Use Yes    Comment: 1-2 times a month        Allergies      Allergies  Allergen Reactions   Propoxyphene Swelling              Current Outpatient Medications  Medication Sig Dispense Refill   benazepril-hydrochlorthiazide (LOTENSIN HCT) 20-12.5 MG per tablet Take 1 tablet by mouth daily.       buPROPion (WELLBUTRIN XL) 300 MG 24 hr tablet 1 tablet in the morning       cholecalciferol (VITAMIN D3) 25 MCG (1000 UNIT) tablet Take 1,000 Units by mouth daily.       ciclopirox (PENLAC) 8 % solution Apply topically at bedtime. Apply over nail and surrounding skin. Apply daily over previous coat. After seven (7) days, may remove with alcohol and continue cycle. 6.6 mL 2   Coenzyme Q10 100 MG capsule Take 3 capsules by mouth daily.       Coenzyme Q10-Red Yeast Rice 60-600 MG CAPS Take 1 capsule by mouth at bedtime.       FLUoxetine (PROZAC) 40 MG capsule Take by mouth.       Misc Natural Products (BUTCHERS BROOM PO) Take by mouth. Take 2 tablets at bedtime       NON FORMULARY Guggul take 3 tabs at bed time Perfect eyes take 2 tabs daily Phytomulti Take 1 tablet once a day Probiotic take 2 tabs on an empty  stomach Omegazyme take 1 tablet three times a day with a meal Elderberry defense take 2 tabs in the morning and 1 at lunch  Raw calcium take 2 tabs at lunch and at tabs at dinner Adaptocrine Take 1 tab daily Nattozimes 3 tabs daily on an empty stomach (takes in place of aspirin)       Tavaborole (KERYDIN) 5 % SOLN Apply 1 drop topically daily. Apply 1 drop to the toenail daily. 10 mL 2   terbinafine (LAMISIL) 250 MG tablet Take 1 tablet (250 mg total) by mouth daily. 90 tablet 0      No current facility-administered medications for this visit.        Review of Systems  Constitutional:  Negative for malaise/fatigue and weight loss.  Respiratory:  Negative for cough and shortness of breath.   Cardiovascular:  Negative for chest pain.  Neurological: Negative.   Psychiatric/Behavioral: Negative.          PHYSICAL EXAMINATION: Vitals:   10/08/23 1022  BP: (!) 148/85  Pulse: 61  Resp: 20  SpO2: 97%    Physical Exam Constitutional:      General: She is not in acute distress.    Appearance: She is not ill-appearing.  Eyes:     Extraocular Movements: Extraocular movements intact.  Cardiovascular:     Rate and Rhythm: Normal rate.  Pulmonary:     Effort: Pulmonary effort is normal. No respiratory  distress.  Abdominal:     General: Abdomen is flat. There is distension.  Musculoskeletal:        General: No swelling.     Cervical back: Normal range of motion.  Skin:    General: Skin is warm and dry.  Neurological:     General: No focal deficit present.     Mental Status: She is alert and oriented to person, place, and time.        Diagnostic Studies & Laboratory data:    CT Scan:     EGD/EUS: none         I have independently reviewed the above radiology studies  and reviewed the findings with the patient.    Recent Lab Findings: Recent Labs       Lab Results  Component Value Date    WBC 6.7 12/11/2021    HGB 13.9 12/11/2021    HCT 41.5 12/11/2021    PLT  217 12/11/2021    GLUCOSE 109 (H) 08/12/2010    CHOL 221 (H) 04/05/2013    TRIG 109 04/05/2013    HDL 57 04/05/2013    LDLCALC 142 (H) 04/05/2013    ALT 11 12/11/2021    AST 15 12/11/2021    NA 140 08/12/2010    K 3.9 08/12/2010    CL 106 08/12/2010    CREATININE 0.80 08/12/2010    BUN 16 08/12/2010    CO2 24 08/12/2010    INR 0.97 08/12/2010            Assessment / Plan:   67yo female with Type IV hiatal hernia with stomach and colon in the defect.  She has compensated well given the size.  She would still like to proceed with surgery.  We discussed the risks and benefits of an EGD with robotic assisted hiatal hernia repair.  She is scheduled for the first week in December.      I  spent 40 minutes with the patient face to face counseling and coordination of care.     Macaria Bias Keane Scrape

## 2023-10-08 NOTE — H&P (View-Only) (Signed)
 [address]       [place] [address]             [phone]                                                   Kristen [name] [facility] Medical Record #[id number] Date of Birth: 13-Aug-1956   Referring: [name], DO Primary Care: Mila Palmer, MD Primary Cardiologist: Lance Muss, MD   Chief Complaint:        Chief Complaint  Patient presents with   Hiatal Hernia      New patient consultation, chest CT 5/14, PFTs 7/26      History of Present Illness:    Kristen [name] 67 y.o. female presents for surgical evaluation a large type IV hiatal hernia.  She claims that she is minimally symptomatic, but on further questioning, she has made drastic changes to her diet and eating habits over the last several decades.  She eats regular food, but only very small portions.  She does have reflux with tomatoes, but avoids most foods.  She is able to lay flat without any major symptoms.  She denies any odynophagia.  She does have some dysphagia if she eats too fast.  She also has bouts of constipation and diarrhea.  She denies any shortness of breath or respiratory symptoms.     She comes today for follow-up.  She has no new symptoms or complaints.     Zubrod Score: At the time of surgery this patient's most appropriate activity status/level should be described as: [x]     0    Normal activity, no symptoms []     1    Restricted in physical strenuous activity but ambulatory, able to do out light work []     2    Ambulatory and capable of self care, unable to do work activities, up and about               >50 % of waking hours                              []     3    Only limited self care, in bed greater than 50% of waking hours []     4    Completely disabled, no self care, confined to bed or chair []     5    Moribund         Past Medical History:  Diagnosis Date   Abnormal Pap smear of cervix 2005    colpo bx and conization - normal since    Depression     H/O syncope     Hypertension     Syncope and collapse 12/14/2008    echo EF 55-60% normal               Past Surgical History:  Procedure Laterality Date   BREAST SURGERY Right 1983    benign biopsy   DILATION AND CURETTAGE OF UTERUS   1980   OTHER SURGICAL HISTORY        benign lumpectomy left               Family History  Problem Relation Age of Onset   Heart disease Mother  Arrhythmia Mother          a fib   Breast cancer Mother 87   Heart attack Father 57   Stroke Father     Cancer Maternal Grandmother          colon            Tobacco Use History  Social History        Tobacco Use  Smoking Status Never   Passive exposure: Yes  Smokeless Tobacco Never      Social History        Substance and Sexual Activity  Alcohol Use Yes    Comment: 1-2 times a month        Allergies      Allergies  Allergen Reactions   Propoxyphene Swelling              Current Outpatient Medications  Medication Sig Dispense Refill   benazepril-hydrochlorthiazide (LOTENSIN HCT) 20-12.5 MG per tablet Take 1 tablet by mouth daily.       buPROPion (WELLBUTRIN XL) 300 MG 24 hr tablet 1 tablet in the morning       cholecalciferol (VITAMIN D3) 25 MCG (1000 UNIT) tablet Take 1,000 Units by mouth daily.       ciclopirox (PENLAC) 8 % solution Apply topically at bedtime. Apply over nail and surrounding skin. Apply daily over previous coat. After seven (7) days, may remove with alcohol and continue cycle. 6.6 mL 2   Coenzyme Q10 100 MG capsule Take 3 capsules by mouth daily.       Coenzyme Q10-Red Yeast Rice 60-600 MG CAPS Take 1 capsule by mouth at bedtime.       FLUoxetine (PROZAC) 40 MG capsule Take by mouth.       Misc Natural Products (BUTCHERS BROOM PO) Take by mouth. Take 2 tablets at bedtime       NON FORMULARY Guggul take 3 tabs at bed time Perfect eyes take 2 tabs daily Phytomulti Take 1 tablet once a day Probiotic take 2 tabs on an empty  stomach Omegazyme take 1 tablet three times a day with a meal Elderberry defense take 2 tabs in the morning and 1 at lunch  Raw calcium take 2 tabs at lunch and at tabs at dinner Adaptocrine Take 1 tab daily Nattozimes 3 tabs daily on an empty stomach (takes in place of aspirin)       Tavaborole (KERYDIN) 5 % SOLN Apply 1 drop topically daily. Apply 1 drop to the toenail daily. 10 mL 2   terbinafine (LAMISIL) 250 MG tablet Take 1 tablet (250 mg total) by mouth daily. 90 tablet 0      No current facility-administered medications for this visit.        Review of Systems  Constitutional:  Negative for malaise/fatigue and weight loss.  Respiratory:  Negative for cough and shortness of breath.   Cardiovascular:  Negative for chest pain.  Neurological: Negative.   Psychiatric/Behavioral: Negative.          PHYSICAL EXAMINATION: Vitals:   10/08/23 1022  BP: (!) 148/85  Pulse: 61  Resp: 20  SpO2: 97%    Physical Exam Constitutional:      General: She is not in acute distress.    Appearance: She is not ill-appearing.  Eyes:     Extraocular Movements: Extraocular movements intact.  Cardiovascular:     Rate and Rhythm: Normal rate.  Pulmonary:     Effort: Pulmonary effort is normal. No respiratory  distress.  Abdominal:     General: Abdomen is flat. There is distension.  Musculoskeletal:        General: No swelling.     Cervical back: Normal range of motion.  Skin:    General: Skin is warm and dry.  Neurological:     General: No focal deficit present.     Mental Status: She is alert and oriented to person, place, and time.        Diagnostic Studies & Laboratory data:    CT Scan:     EGD/EUS: none         I have independently reviewed the above radiology studies  and reviewed the findings with the patient.    Recent Lab Findings: Recent Labs       Lab Results  Component Value Date    WBC 6.7 12/11/2021    HGB 13.9 12/11/2021    HCT 41.5 12/11/2021    PLT  217 12/11/2021    GLUCOSE 109 (H) 08/12/2010    CHOL 221 (H) 04/05/2013    TRIG 109 04/05/2013    HDL 57 04/05/2013    LDLCALC 142 (H) 04/05/2013    ALT 11 12/11/2021    AST 15 12/11/2021    NA 140 08/12/2010    K 3.9 08/12/2010    CL 106 08/12/2010    CREATININE 0.80 08/12/2010    BUN 16 08/12/2010    CO2 24 08/12/2010    INR 0.97 08/12/2010            Assessment / Plan:   67yo female with Type IV hiatal hernia with stomach and colon in the defect.  She has compensated well given the size.  She would still like to proceed with surgery.  We discussed the risks and benefits of an EGD with robotic assisted hiatal hernia repair.  She is scheduled for the first week in December.      I  spent 40 minutes with the patient face to face counseling and coordination of care.     Macaria Bias Keane Scrape

## 2023-10-15 ENCOUNTER — Ambulatory Visit: Payer: Medicare PPO | Admitting: Thoracic Surgery (Cardiothoracic Vascular Surgery)

## 2023-10-20 NOTE — Pre-Procedure Instructions (Signed)
Surgical Instructions   Your procedure is scheduled on October 27, 2023. Report to [facility] Main Entrance "A" at 6:30 A.M., then check in with the Admitting office. Any questions or running late day of surgery: call [phone]  Questions prior to your surgery date: call [phone], Monday-Friday, 8am-4pm. If you experience any cold or flu symptoms such as cough, fever, chills, shortness of breath, etc. between now and your scheduled surgery, please notify us at the above number.     Remember:  Do not eat or drink after midnight the night before your surgery    Take these medicines the morning of surgery with A SIP OF WATER: buPROPion (WELLBUTRIN XL)  FLUoxetine (PROZAC)    May take these medicines IF NEEDED: acetaminophen (TYLENOL)    Continue taking your ASPIRIN through the day before surgery. DO NOT take any the morning of surgery.   One week prior to surgery, STOP taking any Aleve, Naproxen, Ibuprofen, Motrin, Advil, Goody's, BC's, all herbal medications, fish oil, and non-prescription vitamins.                     Do NOT Smoke (Tobacco/Vaping) for 24 hours prior to your procedure.  If you use a CPAP at night, you may bring your mask/headgear for your overnight stay.   You will be asked to remove any contacts, glasses, piercing's, hearing aid's, dentures/partials prior to surgery. Please bring cases for these items if needed.    Patients discharged the day of surgery will not be allowed to drive home, and someone needs to stay with them for 24 hours.  SURGICAL WAITING ROOM VISITATION Patients may have no more than 2 support people in the waiting area - these visitors may rotate.   Pre-op nurse will coordinate an appropriate time for 1 ADULT support person, who may not rotate, to accompany patient in pre-op.  Children under the age of 42 must have an adult with them who is not the patient and must remain in the main waiting area with an adult.  If the patient needs  to stay at the hospital during part of their recovery, the visitor guidelines for inpatient rooms apply.  Please refer to the [facility] website for the visitor guidelines for any additional information.   If you received a COVID test during your pre-op visit  it is requested that you wear a mask when out in public, stay away from anyone that may not be feeling well and notify your surgeon if you develop symptoms. If you have been in contact with anyone that has tested positive in the last 10 days please notify you surgeon.      Pre-operative CHG Bathing Instructions   You can play a key role in reducing the risk of infection after surgery. Your skin needs to be as free of germs as possible. You can reduce the number of germs on your skin by washing with CHG (chlorhexidine gluconate) soap before surgery. CHG is an antiseptic soap that kills germs and continues to kill germs even after washing.   DO NOT use if you have an allergy to chlorhexidine/CHG or antibacterial soaps. If your skin becomes reddened or irritated, stop using the CHG and notify one of our RNs at [phone].              TAKE A SHOWER THE NIGHT BEFORE SURGERY AND THE DAY OF SURGERY    Please keep in mind the following:  DO NOT shave, including legs and underarms,  48 hours prior to surgery.   You may shave your face before/day of surgery.  Place clean sheets on your bed the night before surgery Use a clean washcloth (not used since being washed) for each shower. DO NOT sleep with pet's night before surgery.  CHG Shower Instructions:  Wash your face and private area with normal soap. If you choose to wash your hair, wash first with your normal shampoo.  After you use shampoo/soap, rinse your hair and body thoroughly to remove shampoo/soap residue.  Turn the water OFF and apply half the bottle of CHG soap to a CLEAN washcloth.  Apply CHG soap ONLY FROM YOUR NECK DOWN TO YOUR TOES (washing for 3-5 minutes)  DO NOT use  CHG soap on face, private areas, open wounds, or sores.  Pay special attention to the area where your surgery is being performed.  If you are having back surgery, having someone wash your back for you may be helpful. Wait 2 minutes after CHG soap is applied, then you may rinse off the CHG soap.  Pat dry with a clean towel  Put on clean pajamas    Additional instructions for the day of surgery: DO NOT APPLY any lotions, deodorants, cologne, or perfumes.   Do not wear jewelry or makeup Do not wear nail polish, gel polish, artificial nails, or any other type of covering on natural nails (fingers and toes) Do not bring valuables to the hospital. [facility] is not responsible for valuables/personal belongings. Put on clean/comfortable clothes.  Please brush your teeth.  Ask your nurse before applying any prescription medications to the skin.

## 2023-10-25 ENCOUNTER — Other Ambulatory Visit: Payer: Self-pay

## 2023-10-25 ENCOUNTER — Encounter (HOSPITAL_COMMUNITY)
Admission: RE | Admit: 2023-10-25 | Discharge: 2023-10-25 | Disposition: A | Discharge status: Home / Self Care | Payer: Medicare PPO | Source: Ambulatory Visit | Attending: Thoracic Surgery (Cardiothoracic Vascular Surgery)

## 2023-10-25 ENCOUNTER — Ambulatory Visit (HOSPITAL_COMMUNITY)
Admission: RE | Admit: 2023-10-25 | Discharge: 2023-10-25 | Disposition: A | Discharge status: Home / Self Care | Payer: Medicare PPO | Source: Ambulatory Visit | Attending: Thoracic Surgery (Cardiothoracic Vascular Surgery) | Admitting: Thoracic Surgery (Cardiothoracic Vascular Surgery)

## 2023-10-25 ENCOUNTER — Encounter (HOSPITAL_COMMUNITY): Payer: Self-pay

## 2023-10-25 VITALS — BP 113/72 | HR 62 | Temp 98.1°F | Resp 18 | Ht 63.5 in | Wt 157.2 lb

## 2023-10-25 DIAGNOSIS — Z01818 Encounter for other preprocedural examination: Secondary | ICD-10-CM

## 2023-10-25 DIAGNOSIS — K449 Diaphragmatic hernia without obstruction or gangrene: Secondary | ICD-10-CM

## 2023-10-25 HISTORY — DX: Gastro-esophageal reflux disease without esophagitis: K21.9

## 2023-10-25 HISTORY — DX: Personal history of other diseases of the digestive system: Z87.19

## 2023-10-25 HISTORY — DX: Atherosclerotic heart disease of native coronary artery without angina pectoris: I25.10

## 2023-10-25 HISTORY — DX: Transient cerebral ischemic attack, unspecified: G45.9

## 2023-10-25 LAB — URINALYSIS, ROUTINE W REFLEX MICROSCOPIC
Bacteria, UA: NONE SEEN
Bilirubin Urine: NEGATIVE
Glucose, UA: NEGATIVE mg/dL
Hgb urine dipstick: NEGATIVE
Ketones, ur: NEGATIVE mg/dL
Nitrite: NEGATIVE
Protein, ur: NEGATIVE mg/dL
Specific Gravity, Urine: 1.016 (ref 1.005–1.030)
pH: 6 (ref 5.0–8.0)

## 2023-10-25 LAB — CBC
HCT: 39.5 % (ref 36.0–46.0)
Hemoglobin: 12.9 g/dL (ref 12.0–15.0)
MCH: 30.2 pg (ref 26.0–34.0)
MCHC: 32.7 g/dL (ref 30.0–36.0)
MCV: 92.5 fL (ref 80.0–100.0)
Platelets: 225 10*3/uL (ref 150–400)
RBC: 4.27 MIL/uL (ref 3.87–5.11)
RDW: 13.2 % (ref 11.5–15.5)
WBC: 6.5 10*3/uL (ref 4.0–10.5)
nRBC: 0 % (ref 0.0–0.2)

## 2023-10-25 LAB — SARS CORONAVIRUS 2 BY RT PCR: SARS Coronavirus 2 by RT PCR: NEGATIVE

## 2023-10-25 LAB — COMPREHENSIVE METABOLIC PANEL
ALT: 19 U/L (ref 0–44)
AST: 20 U/L (ref 15–41)
Albumin: 3.7 g/dL (ref 3.5–5.0)
Alkaline Phosphatase: 48 U/L (ref 38–126)
Anion gap: 7 (ref 5–15)
BUN: 20 mg/dL (ref 8–23)
CO2: 25 mmol/L (ref 22–32)
Calcium: 9.2 mg/dL (ref 8.9–10.3)
Chloride: 106 mmol/L (ref 98–111)
Creatinine, Ser: 0.91 mg/dL (ref 0.44–1.00)
GFR, Estimated: >60 mL/min (ref >60)
Glucose, Bld: 100 mg/dL — ABNORMAL HIGH (ref 70–99)
Potassium: 4.2 mmol/L (ref 3.5–5.1)
Sodium: 138 mmol/L (ref 135–145)
Total Bilirubin: 0.8 mg/dL (ref <1.2)
Total Protein: 6.9 g/dL (ref 6.5–8.1)

## 2023-10-25 LAB — TYPE AND SCREEN
ABO/RH(D): A NEG
Antibody Screen: NEGATIVE

## 2023-10-25 LAB — SURGICAL PCR SCREEN
MRSA, PCR: NEGATIVE
Staphylococcus aureus: NEGATIVE

## 2023-10-25 LAB — PROTIME-INR
INR: 1 (ref 0.8–1.2)
Prothrombin Time: 13.8 s (ref 11.4–15.2)

## 2023-10-25 LAB — APTT: aPTT: 32 s (ref 24–36)

## 2023-10-25 NOTE — Progress Notes (Signed)
PCP - Dr. Mila Palmer Cardiologist - Dr. Eldridge Dace - last seen 03/18/22  PPM/ICD - denies Device Orders - n/a Rep Notified - n/a  Chest x-ray - 10/25/23 EKG - 10/25/23 Stress Test - 06/09/22 ECHO - 2010 Cardiac Cath - denies  Sleep Study - denies CPAP - n/a  Fasting Blood Sugar - n/a   Last dose of GLP1 agonist-  n/a GLP1 instructions: n/a  Blood Thinner Instructions: n/a Aspirin Instructions: continue taking - do not take DOS - patient aware  ERAS Protcol - NPO   COVID TEST- pending   Anesthesia review: yes  Patient denies shortness of breath, fever, cough and chest pain at PAT appointment   All instructions explained to the patient, with a verbal understanding of the material. Patient agrees to go over the instructions while at home for a better understanding. Patient also instructed to self quarantine after being tested for COVID-19. The opportunity to ask questions was provided.

## 2023-10-25 NOTE — Progress Notes (Signed)
Nodules are stable she needs a repeat ct chest in 18-24 months   Thanks,  BLI  [name], DO [facility] 10/25/2023 11:35 AM

## 2023-10-26 NOTE — Anesthesia Preprocedure Evaluation (Signed)
Anesthesia Evaluation  Patient identified by MRN, date of birth, ID band Patient awake    Reviewed: Allergy & Precautions, NPO status , Patient's Chart, lab work & pertinent test results  History of Anesthesia Complications Negative for: history of anesthetic complications  Airway Mallampati: II  TM Distance: >3 FB Neck ROM: Full    Dental  (+) Dental Advisory Given   Pulmonary neg pulmonary ROS   breath sounds clear to auscultation       Cardiovascular hypertension, Pt. 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   Endo/Other  negative endocrine ROS    Renal/GU negative Renal ROS     Musculoskeletal   Abdominal   Peds  Hematology negative hematology ROS (+)   Anesthesia Other Findings   Reproductive/Obstetrics                              Anesthesia Physical Anesthesia Plan  ASA: 3  Anesthesia Plan: General   Post-op Pain Management: Tylenol PO (pre-op)*   Induction: Intravenous  PONV Risk Score and Plan: 3 and Ondansetron, Dexamethasone and Treatment may vary due to age or medical condition  Airway Management Planned: Oral ETT and Double Lumen EBT  Additional Equipment: Arterial line  Intra-op Plan:   Post-operative Plan: Extubation in OR  Informed Consent: I have reviewed the patients History and Physical, chart, labs and discussed the procedure including the risks, benefits and alternatives for the proposed anesthesia with the patient or authorized representative who has indicated his/her understanding and acceptance.     Dental advisory given  Plan Discussed with: CRNA and Surgeon  Anesthesia Plan Comments:           Anesthesia Quick Evaluation

## 2023-10-27 ENCOUNTER — Other Ambulatory Visit: Payer: Self-pay

## 2023-10-27 ENCOUNTER — Ambulatory Visit (HOSPITAL_COMMUNITY): Payer: Self-pay | Admitting: Physician Assistant

## 2023-10-27 ENCOUNTER — Encounter (HOSPITAL_COMMUNITY)
Admission: RE | Disposition: A | Discharge status: Home / Self Care | Payer: Self-pay | Source: Home / Self Care | Attending: Thoracic Surgery (Cardiothoracic Vascular Surgery)

## 2023-10-27 ENCOUNTER — Ambulatory Visit (HOSPITAL_COMMUNITY): Payer: Self-pay | Admitting: Anesthesiology

## 2023-10-27 ENCOUNTER — Inpatient Hospital Stay (HOSPITAL_COMMUNITY): Payer: Medicare PPO

## 2023-10-27 ENCOUNTER — Encounter (HOSPITAL_COMMUNITY): Payer: Self-pay | Admitting: Thoracic Surgery (Cardiothoracic Vascular Surgery)

## 2023-10-27 ENCOUNTER — Observation Stay (HOSPITAL_COMMUNITY)
Admission: RE | Admit: 2023-10-27 | Discharge: 2023-10-28 | Disposition: A | Discharge status: Home / Self Care | Payer: Medicare PPO | Attending: Thoracic Surgery (Cardiothoracic Vascular Surgery) | Admitting: Thoracic Surgery (Cardiothoracic Vascular Surgery)

## 2023-10-27 DIAGNOSIS — Z79899 Other long term (current) drug therapy: Secondary | ICD-10-CM | POA: Diagnosis not present

## 2023-10-27 DIAGNOSIS — K449 Diaphragmatic hernia without obstruction or gangrene: Principal | ICD-10-CM | POA: Insufficient documentation

## 2023-10-27 DIAGNOSIS — I10 Essential (primary) hypertension: Secondary | ICD-10-CM | POA: Insufficient documentation

## 2023-10-27 DIAGNOSIS — Z8719 Personal history of other diseases of the digestive system: Principal | ICD-10-CM

## 2023-10-27 DIAGNOSIS — I2510 Atherosclerotic heart disease of native coronary artery without angina pectoris: Secondary | ICD-10-CM | POA: Diagnosis not present

## 2023-10-27 DIAGNOSIS — R918 Other nonspecific abnormal finding of lung field: Secondary | ICD-10-CM | POA: Diagnosis not present

## 2023-10-27 DIAGNOSIS — J982 Interstitial emphysema: Secondary | ICD-10-CM | POA: Diagnosis not present

## 2023-10-27 HISTORY — PX: XI ROBOTIC ASSISTED PARAESOPHAGEAL HERNIA REPAIR: SHX6871

## 2023-10-27 HISTORY — PX: ESOPHAGOGASTRODUODENOSCOPY: SHX5428

## 2023-10-27 LAB — CBC
HCT: 36.4 % (ref 36.0–46.0)
Hemoglobin: 11.8 g/dL — ABNORMAL LOW (ref 12.0–15.0)
MCH: 29.7 pg (ref 26.0–34.0)
MCHC: 32.4 g/dL (ref 30.0–36.0)
MCV: 91.7 fL (ref 80.0–100.0)
Platelets: 203 10*3/uL (ref 150–400)
RBC: 3.97 MIL/uL (ref 3.87–5.11)
RDW: 13.3 % (ref 11.5–15.5)
WBC: 10.2 10*3/uL (ref 4.0–10.5)
nRBC: 0 % (ref 0.0–0.2)

## 2023-10-27 LAB — ABO/RH: ABO/RH(D): A NEG

## 2023-10-27 SURGERY — REPAIR, HERNIA, PARAESOPHAGEAL, ROBOT-ASSISTED
Anesthesia: General | Site: Chest

## 2023-10-27 MED ORDER — ACETAMINOPHEN 500 MG PO TABS
1000.0000 mg | ORAL_TABLET | Freq: Once | ORAL | Status: AC
  Administered 2023-10-27: 1000 mg via ORAL
  Filled 2023-10-27: qty 2

## 2023-10-27 MED ORDER — ONDANSETRON HCL 4 MG/2ML IJ SOLN
INTRAMUSCULAR | Status: DC | PRN
  Administered 2023-10-27 (×2): 4 mg via INTRAVENOUS

## 2023-10-27 MED ORDER — DEXAMETHASONE SODIUM PHOSPHATE 10 MG/ML IJ SOLN
INTRAMUSCULAR | Status: AC
  Filled 2023-10-27: qty 1

## 2023-10-27 MED ORDER — CEFAZOLIN SODIUM-DEXTROSE 2-4 GM/100ML-% IV SOLN
2.0000 g | INTRAVENOUS | Status: AC
  Administered 2023-10-27: 2 g via INTRAVENOUS
  Filled 2023-10-27: qty 100

## 2023-10-27 MED ORDER — ACETAMINOPHEN 325 MG PO TABS
650.0000 mg | ORAL_TABLET | Freq: Four times a day (QID) | ORAL | Status: DC | PRN
  Administered 2023-10-28: 650 mg via ORAL
  Filled 2023-10-27: qty 2

## 2023-10-27 MED ORDER — ROCURONIUM BROMIDE 10 MG/ML (PF) SYRINGE
PREFILLED_SYRINGE | INTRAVENOUS | Status: AC
  Filled 2023-10-27: qty 10

## 2023-10-27 MED ORDER — LIDOCAINE 2% (20 MG/ML) 5 ML SYRINGE
INTRAMUSCULAR | Status: DC | PRN
  Administered 2023-10-27: 60 mg via INTRAVENOUS

## 2023-10-27 MED ORDER — PROPOFOL 10 MG/ML IV BOLUS
INTRAVENOUS | Status: DC | PRN
  Administered 2023-10-27: 100 mg via INTRAVENOUS

## 2023-10-27 MED ORDER — ORAL CARE MOUTH RINSE: 15.0000 mL | Freq: Once | OROMUCOSAL | Status: AC

## 2023-10-27 MED ORDER — HYDROMORPHONE HCL 1 MG/ML IJ SOLN
INTRAMUSCULAR | Status: DC | PRN
  Administered 2023-10-27: .5 mg via INTRAVENOUS

## 2023-10-27 MED ORDER — LIDOCAINE HCL (CARDIAC) PF 100 MG/5ML IV SOSY: PREFILLED_SYRINGE | INTRAVENOUS | Status: DC | PRN

## 2023-10-27 MED ORDER — BUPROPION HCL ER (XL) 150 MG PO TB24: 300.0000 mg | ORAL_TABLET | Freq: Every day | ORAL | Status: DC

## 2023-10-27 MED ORDER — BUPIVACAINE HCL (PF) 0.5 % IJ SOLN
INTRAMUSCULAR | Status: AC
  Filled 2023-10-27: qty 30

## 2023-10-27 MED ORDER — ASPIRIN 81 MG PO TBEC: 81.0000 mg | DELAYED_RELEASE_TABLET | Freq: Every day | ORAL | Status: DC

## 2023-10-27 MED ORDER — HYDRALAZINE HCL 20 MG/ML IJ SOLN: 10.0000 mg | Freq: Four times a day (QID) | INTRAMUSCULAR | Status: DC | PRN

## 2023-10-27 MED ORDER — LABETALOL HCL 5 MG/ML IV SOLN
INTRAVENOUS | Status: AC
  Filled 2023-10-27: qty 4

## 2023-10-27 MED ORDER — PROPOFOL 10 MG/ML IV BOLUS
INTRAVENOUS | Status: AC
  Filled 2023-10-27: qty 20

## 2023-10-27 MED ORDER — PHENYLEPHRINE 80 MCG/ML (10ML) SYRINGE FOR IV PUSH (FOR BLOOD PRESSURE SUPPORT)
PREFILLED_SYRINGE | INTRAVENOUS | Status: DC | PRN
  Administered 2023-10-27: 160 ug via INTRAVENOUS

## 2023-10-27 MED ORDER — ONDANSETRON HCL 4 MG/2ML IJ SOLN: 4.0000 mg | Freq: Four times a day (QID) | INTRAMUSCULAR | Status: DC | PRN

## 2023-10-27 MED ORDER — ACETAMINOPHEN 650 MG RE SUPP: 650.0000 mg | Freq: Four times a day (QID) | RECTAL | Status: DC | PRN

## 2023-10-27 MED ORDER — ORAL CARE MOUTH RINSE: 15.0000 mL | OROMUCOSAL | Status: DC | PRN

## 2023-10-27 MED ORDER — BUPIVACAINE LIPOSOME 1.3 % IJ SUSP
INTRAMUSCULAR | Status: AC
  Filled 2023-10-27: qty 20

## 2023-10-27 MED ORDER — MIDAZOLAM HCL 2 MG/2ML IJ SOLN: 0.5000 mg | Freq: Once | INTRAMUSCULAR | Status: DC | PRN

## 2023-10-27 MED ORDER — LACTATED RINGERS IV SOLN
INTRAVENOUS | Status: DC
Start: 2023-10-27 — End: 2023-10-27

## 2023-10-27 MED ORDER — FENTANYL CITRATE (PF) 250 MCG/5ML IJ SOLN
INTRAMUSCULAR | Status: DC | PRN
  Administered 2023-10-27 (×2): 50 ug via INTRAVENOUS
  Administered 2023-10-27: 100 ug via INTRAVENOUS
  Administered 2023-10-27: 50 ug via INTRAVENOUS

## 2023-10-27 MED ORDER — BENAZEPRIL HCL 5 MG PO TABS: 10.0000 mg | ORAL_TABLET | Freq: Every day | ORAL | Status: DC

## 2023-10-27 MED ORDER — DEXAMETHASONE SODIUM PHOSPHATE 10 MG/ML IJ SOLN
INTRAMUSCULAR | Status: DC | PRN
  Administered 2023-10-27: 10 mg via INTRAVENOUS

## 2023-10-27 MED ORDER — CEFAZOLIN SODIUM-DEXTROSE 2-4 GM/100ML-% IV SOLN
2.0000 g | Freq: Three times a day (TID) | INTRAVENOUS | Status: AC
  Administered 2023-10-27: 2 g via INTRAVENOUS
  Filled 2023-10-27: qty 100

## 2023-10-27 MED ORDER — FENTANYL CITRATE (PF) 250 MCG/5ML IJ SOLN
INTRAMUSCULAR | Status: AC
  Filled 2023-10-27: qty 5

## 2023-10-27 MED ORDER — OXYCODONE HCL 5 MG PO TABS: 5.0000 mg | ORAL_TABLET | Freq: Once | ORAL | Status: DC | PRN

## 2023-10-27 MED ORDER — EPHEDRINE SULFATE-NACL 50-0.9 MG/10ML-% IV SOSY
PREFILLED_SYRINGE | INTRAVENOUS | Status: DC | PRN
  Administered 2023-10-27: 10 mg via INTRAVENOUS

## 2023-10-27 MED ORDER — FLUOXETINE HCL 20 MG PO CAPS
80.0000 mg | ORAL_CAPSULE | Freq: Every day | ORAL | Status: DC
Start: 2023-10-28 — End: 2023-10-27

## 2023-10-27 MED ORDER — FENTANYL CITRATE PF 50 MCG/ML IJ SOSY
25.0000 ug | PREFILLED_SYRINGE | INTRAMUSCULAR | Status: DC | PRN
  Administered 2023-10-27 – 2023-10-28 (×5): 25 ug via INTRAVENOUS
  Filled 2023-10-27 (×5): qty 1

## 2023-10-27 MED ORDER — ONDANSETRON HCL 4 MG/2ML IJ SOLN
INTRAMUSCULAR | Status: AC
  Filled 2023-10-27: qty 2

## 2023-10-27 MED ORDER — ENOXAPARIN SODIUM 40 MG/0.4ML IJ SOSY
40.0000 mg | PREFILLED_SYRINGE | INTRAMUSCULAR | Status: DC
  Administered 2023-10-28: 40 mg via SUBCUTANEOUS
  Filled 2023-10-27: qty 0.4

## 2023-10-27 MED ORDER — HYDROMORPHONE HCL 1 MG/ML IJ SOLN
INTRAMUSCULAR | Status: AC
  Administered 2023-10-27: 0.5 mg via INTRAVENOUS
  Filled 2023-10-27: qty 2

## 2023-10-27 MED ORDER — LIDOCAINE 2% (20 MG/ML) 5 ML SYRINGE
INTRAMUSCULAR | Status: AC
  Filled 2023-10-27: qty 5

## 2023-10-27 MED ORDER — PHENYLEPHRINE HCL-NACL 20-0.9 MG/250ML-% IV SOLN
INTRAVENOUS | Status: DC | PRN
  Administered 2023-10-27: 20 ug/min via INTRAVENOUS

## 2023-10-27 MED ORDER — SUGAMMADEX SODIUM 200 MG/2ML IV SOLN
INTRAVENOUS | Status: DC | PRN
  Administered 2023-10-27: 200 mg via INTRAVENOUS

## 2023-10-27 MED ORDER — KETOROLAC TROMETHAMINE 15 MG/ML IJ SOLN
15.0000 mg | Freq: Four times a day (QID) | INTRAMUSCULAR | Status: DC | PRN
  Administered 2023-10-27 – 2023-10-28 (×2): 15 mg via INTRAVENOUS
  Filled 2023-10-27 (×2): qty 1

## 2023-10-27 MED ORDER — PANTOPRAZOLE SODIUM 40 MG IV SOLR
40.0000 mg | Freq: Every day | INTRAVENOUS | Status: DC
  Administered 2023-10-27: 40 mg via INTRAVENOUS
  Filled 2023-10-27: qty 10

## 2023-10-27 MED ORDER — ONDANSETRON 4 MG PO TBDP: 4.0000 mg | ORAL_TABLET | Freq: Four times a day (QID) | ORAL | Status: DC | PRN

## 2023-10-27 MED ORDER — PHENYLEPHRINE 80 MCG/ML (10ML) SYRINGE FOR IV PUSH (FOR BLOOD PRESSURE SUPPORT)
PREFILLED_SYRINGE | INTRAVENOUS | Status: AC
Start: 2023-10-27 — End: ?
  Filled 2023-10-27: qty 10

## 2023-10-27 MED ORDER — ALBUMIN HUMAN 5 % IV SOLN: INTRAVENOUS | Status: DC | PRN

## 2023-10-27 MED ORDER — SUCCINYLCHOLINE CHLORIDE 200 MG/10ML IV SOSY
PREFILLED_SYRINGE | INTRAVENOUS | Status: DC | PRN
  Administered 2023-10-27: 100 mg via INTRAVENOUS

## 2023-10-27 MED ORDER — HYDROMORPHONE HCL 1 MG/ML IJ SOLN
INTRAMUSCULAR | Status: AC
  Filled 2023-10-27: qty 0.5

## 2023-10-27 MED ORDER — 0.9 % SODIUM CHLORIDE (POUR BTL) OPTIME
TOPICAL | Status: DC | PRN
  Administered 2023-10-27: 2000 mL

## 2023-10-27 MED ORDER — HYDROMORPHONE HCL 1 MG/ML IJ SOLN
0.2500 mg | INTRAMUSCULAR | Status: DC | PRN
  Administered 2023-10-27 (×3): 0.5 mg via INTRAVENOUS

## 2023-10-27 MED ORDER — BUPIVACAINE LIPOSOME 1.3 % IJ SUSP
INTRAMUSCULAR | Status: DC | PRN
  Administered 2023-10-27: 50 mL

## 2023-10-27 MED ORDER — LABETALOL HCL 5 MG/ML IV SOLN
INTRAVENOUS | Status: DC | PRN
  Administered 2023-10-27: 5 mg via INTRAVENOUS

## 2023-10-27 MED ORDER — OXYCODONE HCL 5 MG/5ML PO SOLN: 5.0000 mg | Freq: Once | ORAL | Status: DC | PRN

## 2023-10-27 MED ORDER — CHLORHEXIDINE GLUCONATE 0.12 % MT SOLN
15.0000 mL | Freq: Once | OROMUCOSAL | Status: AC
  Administered 2023-10-27: 15 mL via OROMUCOSAL
  Filled 2023-10-27: qty 15

## 2023-10-27 MED ORDER — DEXTROSE-SODIUM CHLORIDE 5-0.45 % IV SOLN: INTRAVENOUS | Status: DC

## 2023-10-27 MED ORDER — ROCURONIUM BROMIDE 10 MG/ML (PF) SYRINGE
PREFILLED_SYRINGE | INTRAVENOUS | Status: DC | PRN
  Administered 2023-10-27: 30 mg via INTRAVENOUS
  Administered 2023-10-27 (×3): 20 mg via INTRAVENOUS
  Administered 2023-10-27: 30 mg via INTRAVENOUS

## 2023-10-27 SURGICAL SUPPLY — 65 items
BLADE SURG 11 STRL SS (BLADE) ×2 IMPLANT
BUTTON OLYMPUS DEFENDO 5 PIECE (MISCELLANEOUS) ×2 IMPLANT
CANISTER SUCT 3000ML PPV (MISCELLANEOUS) ×4 IMPLANT
CNTNR URN SCR LID CUP LEK RST (MISCELLANEOUS) ×2 IMPLANT
DEFOGGER SCOPE WARMER CLEARIFY (MISCELLANEOUS) ×2 IMPLANT
DERMABOND ADVANCED .7 DNX12 (GAUZE/BANDAGES/DRESSINGS) ×2 IMPLANT
DEVICE SUTURE ENDOST 10MM (ENDOMECHANICALS) IMPLANT
DRAPE ARM DVNC X/XI (DISPOSABLE) ×8 IMPLANT
DRAPE COLUMN DVNC XI (DISPOSABLE) ×2 IMPLANT
DRAPE CV SPLIT W-CLR ANES SCRN (DRAPES) ×2 IMPLANT
DRAPE INCISE IOBAN 66X45 STRL (DRAPES) IMPLANT
DRAPE SURG ORHT 6 SPLT 77X108 (DRAPES) ×2 IMPLANT
DRIVER NDL LRG 8 DVNC XI (INSTRUMENTS) IMPLANT
DRIVER NDL MEGA SUTCUT DVNCXI (INSTRUMENTS) IMPLANT
DRIVER NDLE LRG 8 DVNC XI (INSTRUMENTS) ×2 IMPLANT
DRIVER NDLE MEGA SUTCUT DVNCXI (INSTRUMENTS) ×2 IMPLANT
ELECT REM PT RETURN 9FT ADLT (ELECTROSURGICAL) ×2 IMPLANT
ELECTRODE REM PT RTRN 9FT ADLT (ELECTROSURGICAL) ×2 IMPLANT
FELT TEFLON 1X6 (MISCELLANEOUS) IMPLANT
FORCEPS BPLR LNG DVNC XI (INSTRUMENTS) IMPLANT
FORCEPS CADIERE DVNC XI (FORCEP) IMPLANT
GAUZE SPONGE 4X4 12PLY STRL (GAUZE/BANDAGES/DRESSINGS) ×2 IMPLANT
GLOVE BIO SURGEON STRL SZ7 (GLOVE) ×2 IMPLANT
GLOVE BIO SURGEON STRL SZ7.5 (GLOVE) ×6 IMPLANT
GOWN STRL REUS W/ TWL LRG LVL3 (GOWN DISPOSABLE) ×2 IMPLANT
GOWN STRL REUS W/ TWL XL LVL3 (GOWN DISPOSABLE) ×4 IMPLANT
GOWN STRL REUS W/TWL 2XL LVL3 (GOWN DISPOSABLE) ×2 IMPLANT
GRASPER SUT TROCAR 14GX15 (MISCELLANEOUS) IMPLANT
GRASPER TIP-UP FEN DVNC XI (INSTRUMENTS) IMPLANT
HEMOSTAT SURGICEL 2X14 (HEMOSTASIS) ×2 IMPLANT
IV NS 1000ML BAXH (IV SOLUTION) IMPLANT
KIT BASIN OR (CUSTOM PROCEDURE TRAY) ×2 IMPLANT
KIT TURNOVER KIT B (KITS) ×2 IMPLANT
MARKER SKIN DUAL TIP RULER LAB (MISCELLANEOUS) ×2 IMPLANT
MESH OVITEX 1S RESORB 6X10 6L (Mesh General) IMPLANT
NDL HYPO 22X1.5 SAFETY MO (MISCELLANEOUS) ×2 IMPLANT
NEEDLE HYPO 22X1.5 SAFETY MO (MISCELLANEOUS) ×2 IMPLANT
NS IRRIG 1000ML POUR BTL (IV SOLUTION) ×4 IMPLANT
OIL SILICONE PENTAX (PARTS (SERVICE/REPAIRS)) IMPLANT
PACK CHEST (CUSTOM PROCEDURE TRAY) ×2 IMPLANT
PAD ARMBOARD 7.5X6 YLW CONV (MISCELLANEOUS) ×4 IMPLANT
PORT ACCESS TROCAR AIRSEAL 12 (TROCAR) IMPLANT
SEAL UNIV 5-12 XI (MISCELLANEOUS) ×8 IMPLANT
SEALER SYNCHRO 8 IS4000 DVNC (MISCELLANEOUS) IMPLANT
SET TRI-LUMEN FLTR TB AIRSEAL (TUBING) ×2 IMPLANT
SUT ETHIBOND 0 36 GRN (SUTURE) ×4 IMPLANT
SUT SILK 1 MH (SUTURE) ×2 IMPLANT
SUT SURGIDAC NAB ES-9 0 48 120 (SUTURE) IMPLANT
SUT VIC AB 2-0 CT1 18 (SUTURE) ×2 IMPLANT
SUT VIC AB 3-0 SH 27X BRD (SUTURE) ×4 IMPLANT
SUT VICRYL 0 UR6 27IN ABS (SUTURE) ×4 IMPLANT
SUT VLOC 180 2-0 6IN GS21 (SUTURE) IMPLANT
SYR 20ML ECCENTRIC (SYRINGE) ×2 IMPLANT
SYSTEM SAHARA CHEST DRAIN ATS (WOUND CARE) IMPLANT
TOWEL GREEN STERILE (TOWEL DISPOSABLE) ×2 IMPLANT
TOWEL GREEN STERILE FF (TOWEL DISPOSABLE) ×2 IMPLANT
TRAY FOLEY MTR SLVR 16FR STAT (SET/KITS/TRAYS/PACK) ×2 IMPLANT
TRAY FOLEY W/BAG SLVR 14FR (SET/KITS/TRAYS/PACK) IMPLANT
TROCAR PORT AIRSEAL 8X120 (TROCAR) IMPLANT
TROCAR XCEL BLADELESS 5X75MML (TROCAR) ×2 IMPLANT
TROCAR XCEL NON-BLD 5MMX100MML (ENDOMECHANICALS) IMPLANT
TUBE CONNECTING 20X1/4 (TUBING) ×2 IMPLANT
TUBING ENDO SMARTCAP (MISCELLANEOUS) ×2 IMPLANT
UNDERPAD 30X36 HEAVY ABSORB (UNDERPADS AND DIAPERS) ×2 IMPLANT
WATER STERILE IRR 1000ML POUR (IV SOLUTION) ×2 IMPLANT

## 2023-10-27 NOTE — Hospital Course (Addendum)
Referring: [name], DO Primary Care: Mila Palmer, MD Primary Cardiologist: Lance Muss, MD    History of Present Illness:    Kristen [name] 67 y.o. female presents for surgical evaluation a large type IV hiatal hernia.  She claims that she is minimally symptomatic, but on further questioning, she has made drastic changes to her diet and eating habits over the last several decades.  She eats regular food, but only very small portions.  She does have reflux with tomatoes, but avoids most foods.  She is able to lay flat without any major symptoms.  She denies any odynophagia.  She does have some dysphagia if she eats too fast.  She also has bouts of constipation and diarrhea.  She denies any shortness of breath or respiratory symptoms.    Following full review of the patient and all relevant studies Dr. Cliffton Asters recommended robotic assisted repair of the hiatal hernia.  She was admitted electively for the procedure.  Hospital course:  The patient was admitted electively on 10/27/2023 and taken the operating room at which time she underwent robotic repair of type IV hiatal hernia.  She tolerated the procedure well and was taken to the postanesthesia care unit in stable condition.  Postoperative hospital course:  The patient has done well.  Esophagram on postop day 1 showed no leak.  She was started on a course of dysphagia 1 diet which she tolerated.  She advanced in a routine manner in regard to physical activity.  She has maintained stable vital signs and is afebrile.  Postop lab values are stable.  Incisions are healing well without evidence of infection.  Oxygen was weaned and she maintained good saturations on room air.  Overall, at the time of discharge the patient was felt to be quite stable.

## 2023-10-27 NOTE — Anesthesia Procedure Notes (Signed)
Arterial Line Insertion Start/End12/02/2023 8:00 AM, 10/27/2023 8:10 AM Performed by: Jairo Ben, MD, Allyn Kenner, CRNA, CRNA  Patient location: Pre-op. Preanesthetic checklist: patient identified, IV checked, site marked, risks and benefits discussed, surgical consent, monitors and equipment checked, pre-op evaluation, timeout performed and anesthesia consent Lidocaine 1% used for infiltration Right, radial was placed Catheter size: 20 G Hand hygiene performed  and maximum sterile barriers used   Attempts: 1 Procedure performed without using ultrasound guided technique. Following insertion, dressing applied. Post procedure assessment: normal and unchanged

## 2023-10-27 NOTE — Anesthesia Postprocedure Evaluation (Signed)
Anesthesia Post Note  Patient: Kristen [name]  Procedure(s) Performed: XI ROBOTIC ASSISTED PARAESOPHAGEAL HERNIA REPAIR WITH MESH; FUNDAPLICATION (Chest) ESOPHAGOGASTRODUODENOSCOPY (EGD)     Patient location during evaluation: PACU Anesthesia Type: General Level of consciousness: awake and alert, patient cooperative and oriented Pain management: pain level controlled Vital Signs Assessment: post-procedure vital signs reviewed and stable Respiratory status: spontaneous breathing, nonlabored ventilation, respiratory function stable and patient connected to nasal cannula oxygen Cardiovascular status: blood pressure returned to baseline and stable Postop Assessment: no apparent nausea or vomiting and adequate PO intake Anesthetic complications: no   There were no known notable events for this encounter.  Last Vitals:  Vitals:   10/27/23 1330 10/27/23 1400  BP: 135/68 (!) 141/70  Pulse: (!) 55 (!) 57  Resp: 12 15  Temp:  (!) 36.4 C  SpO2: 97% 94%    Last Pain:  Vitals:   10/27/23 1400  PainSc: 3                  [name],E. [name]

## 2023-10-27 NOTE — Interval H&P Note (Signed)
History and Physical Interval Note:  10/27/2023 8:29 AM  Kristen [name]  has presented today for surgery, with the diagnosis of PEH.  The various methods of treatment have been discussed with the patient and family. After consideration of risks, benefits and other options for treatment, the patient has consented to  Procedure(s): XI ROBOTIC ASSISTED PARAESOPHAGEAL HERNIA REPAIR (N/A) ESOPHAGOGASTRODUODENOSCOPY (EGD) (N/A) as a surgical intervention.  The patient's history has been reviewed, patient examined, no change in status, stable for surgery.  I have reviewed the patient's chart and labs.  Questions were answered to the patient's satisfaction.     Ray Gervasi Keane Scrape

## 2023-10-27 NOTE — Op Note (Signed)
[address]       [place] [address]             [phone]        10/27/2023  Patient:  Kristen [name] Pre-Op Dx: Type IV hiatal hernia   Post-op Dx:  same Procedure: - Esophagoscopy - Robotic assisted laparoscopy - Paraesophageal hernia repair with Ovitex 1S mesh pledgets - Dor Fundoplication   Surgeon and Role:      * Yaritsa Savarino, Eliezer Lofts, MD - Primary  Assistant: Webb Laws, PA-C  An experienced assistant was required given the complexity of this surgery and the standard of surgical care. The assistant was needed for exposure, dissection, suctioning, retraction of delicate tissues and sutures, instrument exchange and for overall help during this procedure.   Anesthesia  general EBL:  15ml Blood Administration: none Specimen:  none   Counts: correct   Indications: 67yo female with Type IV hiatal hernia with stomach and colon in the defect.  She has compensated well given the size.  She would still like to proceed with surgery.  We discussed the risks and benefits of an EGD with robotic assisted hiatal hernia repair.   Findings: I was unable to intubate the stomach on initial EGD.  Large type IV hernia with stomach, colon and omentum.  4 posterior stitches, 1 right, and 2 left stiches  Operative Technique: After the risks, benefits and alternatives were thoroughly discussed, the patient was brought to the operative theatre.  Anesthesia was induced, and the esophagoscope was passed through the oropharynx down to the stomach.  The scope was retroflexed and the hiatal hernia was clearly evident.  The scope was pulled back and the mucosal surface of the esophagus was visualized.    The scope was then parked at 25 cm from the incisors.  The patient was then prepped and draped in normal sterile fashion.  An appropriate surgical pause was performed, and pre-operative antibiotics were dosed accordingly.  We began with a 1 cm incision 15 cm caudad from  the xiphoid and slightly lateral to the umbilicus.  Using an Optiview we entered the peritoneal space.  The abdomen was then insufflated with CO2.  3 other robotic ports were placed to triangulate the hiatus.  Another 12 mm port was placed in place at the level of the umbilicus laterally for an assistant port and another 5 mm trocar was placed in the right lower quadrant for liver retractor.  The patient was then placed in steep reverse Trendelenburg and the liver was elevated to expose the esophageal hiatus.  And then the robot was docked.  We began by dividing the gastrohepatic ligament to expose the right diaphragmatic crus and then dissected the hernia sac in a clockwise fashion to mobilize there the stomach and esophagus.  We then divided the short gastrics and moved towards the right crus and completed our dissection along the esophageal hiatus.  A Penrose drain was then used to encircle the the esophagus and we continued our dissection up into the mediastinum.  Once we had achieved 3 to 4 cm of intra-abdominal esophagus we then proceeded to reapproximate the crura with 0 Ethibond sutures in an interrupted fashion.  The gastroscope was passed down through the lower esophageal sphincter into the stomach and would act as our bougie during this repair.  Next the stomach was passed anterior to the esophagus and Dor fundoplication was performed.  An air leak test was performed using the gastroscope.  No leak was evident.  The liver retractor was removed and all ports were removed under direct visualization.  The skin and soft tissue were closed with absorbable suture    The patient tolerated the procedure without any immediate complications, and was transferred to the PACU in stable condition.  Iran Rowe Keane Scrape

## 2023-10-27 NOTE — Anesthesia Procedure Notes (Signed)
Procedure Name: Intubation Date/Time: 10/27/2023 8:54 AM  Performed by: Allyn Kenner, CRNAPre-anesthesia Checklist: Patient identified, Emergency Drugs available, Suction available and Patient being monitored Patient Re-evaluated:Patient Re-evaluated prior to induction Oxygen Delivery Method: Circle System Utilized Preoxygenation: Pre-oxygenation with 100% oxygen Induction Type: IV induction Ventilation: Mask ventilation without difficulty Laryngoscope Size: Mac and 3 Grade View: Grade I Tube type: Oral Tube size: 7.0 mm Number of attempts: 1 Airway Equipment and Method: Stylet and Oral airway Placement Confirmation: ETT inserted through vocal cords under direct vision, positive ETCO2 and breath sounds checked- equal and bilateral Secured at: 21 cm Tube secured with: Tape Dental Injury: Teeth and Oropharynx as per pre-operative assessment

## 2023-10-27 NOTE — Brief Op Note (Signed)
10/27/2023  12:16 PM  PATIENT:  Cathleen Corti  67 y.o. female  PRE-OPERATIVE DIAGNOSIS:  PEH  POST-OPERATIVE DIAGNOSIS:  * No post-op diagnosis entered *  PROCEDURE:  Procedure(s): XI ROBOTIC ASSISTED PARAESOPHAGEAL HERNIA REPAIR WITH MESH (N/A) ESOPHAGOGASTRODUODENOSCOPY (EGD) (N/A)  SURGEON:  Surgeons and Role:    * Lightfoot, Eliezer Lofts, MD - Primary  PHYSICIAN ASSISTANT: Rio Kidane PA-C  ANESTHESIA:   general  EBL:   50 ML  BLOOD ADMINISTERED:none  DRAINS: none   LOCAL MEDICATIONS USED:  BUPIVICAINE  and  EXPAREL  SPECIMEN:  No Specimen  DISPOSITION OF SPECIMEN:  N/A  COUNTS:  YES  TOURNIQUET:  * No tourniquets in log *  DICTATION: .Dragon Dictation  PLAN OF CARE: Admit to inpatient   PATIENT DISPOSITION:  PACU - hemodynamically stable.   Delay start of Pharmacological VTE agent (>24hrs) due to surgical blood loss or risk of bleeding: no  COMPLICATIONS: NO KNOWN
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
## 2023-10-27 NOTE — Transfer of Care (Signed)
Immediate Anesthesia Transfer of Care Note  Patient: Kristen [name]  Procedure(s) Performed: XI ROBOTIC ASSISTED PARAESOPHAGEAL HERNIA REPAIR WITH MESH; FUNDAPLICATION (Chest) ESOPHAGOGASTRODUODENOSCOPY (EGD)  Patient Location: PACU  Anesthesia Type:General  Level of Consciousness: awake, alert , and oriented  Airway & Oxygen Therapy: Patient Spontanous Breathing and Patient connected to nasal cannula oxygen  Post-op Assessment: Report given to RN and Post -op Vital signs reviewed and stable  Post vital signs: Reviewed and stable  Last Vitals:  Vitals Value Taken Time  BP 153/77 10/27/23 1236  Temp    Pulse 63 10/27/23 1238  Resp 13 10/27/23 1238  SpO2 94 % 10/27/23 1238  Vitals shown include unfiled device data.  Last Pain:  Vitals:   10/27/23 0658  PainSc: 0-No pain         Complications: No notable events documented.

## 2023-10-28 ENCOUNTER — Inpatient Hospital Stay (HOSPITAL_COMMUNITY): Payer: Medicare PPO

## 2023-10-28 ENCOUNTER — Encounter (HOSPITAL_COMMUNITY): Payer: Self-pay | Admitting: Thoracic Surgery (Cardiothoracic Vascular Surgery)

## 2023-10-28 DIAGNOSIS — I10 Essential (primary) hypertension: Secondary | ICD-10-CM | POA: Diagnosis not present

## 2023-10-28 DIAGNOSIS — J9811 Atelectasis: Secondary | ICD-10-CM | POA: Diagnosis not present

## 2023-10-28 DIAGNOSIS — J939 Pneumothorax, unspecified: Secondary | ICD-10-CM | POA: Diagnosis not present

## 2023-10-28 DIAGNOSIS — J982 Interstitial emphysema: Secondary | ICD-10-CM | POA: Diagnosis not present

## 2023-10-28 DIAGNOSIS — K224 Dyskinesia of esophagus: Secondary | ICD-10-CM | POA: Diagnosis not present

## 2023-10-28 DIAGNOSIS — K449 Diaphragmatic hernia without obstruction or gangrene: Secondary | ICD-10-CM | POA: Diagnosis not present

## 2023-10-28 DIAGNOSIS — Z79899 Other long term (current) drug therapy: Secondary | ICD-10-CM | POA: Diagnosis not present

## 2023-10-28 LAB — BASIC METABOLIC PANEL
Anion gap: 8 (ref 5–15)
BUN: 17 mg/dL (ref 8–23)
CO2: 24 mmol/L (ref 22–32)
Calcium: 8.8 mg/dL — ABNORMAL LOW (ref 8.9–10.3)
Chloride: 103 mmol/L (ref 98–111)
Creatinine, Ser: 0.9 mg/dL (ref 0.44–1.00)
GFR, Estimated: >60 mL/min (ref >60)
Glucose, Bld: 124 mg/dL — ABNORMAL HIGH (ref 70–99)
Potassium: 4.5 mmol/L (ref 3.5–5.1)
Sodium: 135 mmol/L (ref 135–145)

## 2023-10-28 LAB — CBC
HCT: 33.5 % — ABNORMAL LOW (ref 36.0–46.0)
Hemoglobin: 11.2 g/dL — ABNORMAL LOW (ref 12.0–15.0)
MCH: 30.5 pg (ref 26.0–34.0)
MCHC: 33.4 g/dL (ref 30.0–36.0)
MCV: 91.3 fL (ref 80.0–100.0)
Platelets: 175 10*3/uL (ref 150–400)
RBC: 3.67 MIL/uL — ABNORMAL LOW (ref 3.87–5.11)
RDW: 13.3 % (ref 11.5–15.5)
WBC: 8.7 10*3/uL (ref 4.0–10.5)
nRBC: 0 % (ref 0.0–0.2)

## 2023-10-28 MED ORDER — IOHEXOL 300 MG/ML  SOLN
100.0000 mL | Freq: Once | INTRAMUSCULAR | Status: AC | PRN
Start: 2023-10-28 — End: 2023-10-28
  Administered 2023-10-28: 100 mL via ORAL

## 2023-10-28 MED ORDER — HYDROCODONE-ACETAMINOPHEN 7.5-325 MG/15ML PO SOLN: 10.0000 mL | Freq: Four times a day (QID) | ORAL | 0 refills | Status: AC | PRN

## 2023-10-28 NOTE — Plan of Care (Signed)

## 2023-10-28 NOTE — TOC CM/SW Note (Signed)
Transition of Care [facility]) - Inpatient Brief Assessment   Patient Details  Name: Kristen [name] MRN: [id number] Date of Birth: [birth year]  [facility] CM/SW Contact:    Harriet Masson, RN Phone Number: 10/28/2023, 12:07 PM   Clinical Narrative: 1 Day Post-Op Procedure(s) (LRB): XI ROBOTIC ASSISTED PARAESOPHAGEAL HERNIA REPAIR WITH MESH; FUNDAPLICATION. No TOC needs at this time.   Transition of Care Asessment: Insurance and Status: Insurance coverage has been reviewed Patient has primary care physician: Yes Home environment has been reviewed: safe to discharge home Prior level of function:: independent Prior/Current Home Services: No current home services Social Determinants of Health Reivew: SDOH reviewed no interventions necessary Readmission risk has been reviewed: Yes Transition of care needs: no transition of care needs at this time

## 2023-10-28 NOTE — Care Management CC44 (Signed)
Condition Code 44 Documentation Completed  Patient Details  Name: ASHMI AYENI MRN: [id number] Date of Birth: 13-Aug-1956   Condition Code 44 given:  Yes Patient signature on Condition Code 44 notice:  Yes Documentation of 2 MD's agreement:  Yes Code 44 added to claim:  Yes    Harriet Masson, RN 10/28/2023, 3:17 PM

## 2023-10-28 NOTE — Progress Notes (Signed)
Mobility Specialist Progress Note:   10/28/23 1000  Mobility  Activity Ambulated independently in hallway  Level of Assistance Standby assist, set-up cues, supervision of patient - no hands on  Assistive Device None  Distance Ambulated (ft) 340 ft  Activity Response Tolerated well  Mobility Referral Yes  $Mobility charge 1 Mobility  Mobility Specialist Start Time (ACUTE ONLY) [id number]  Mobility Specialist Stop Time (ACUTE ONLY) 0936  Mobility Specialist Time Calculation (min) (ACUTE ONLY) 7 min    Pt received in bed, agreeable to mobility. Asymptomatic throughout w/ no complaints. Returned to room w/o fault. Pt left in bed with call bell and all needs met. Family present.   Kristen [name] Mobility Specialist Please contact via Special educational needs teacher or Rehab office at [phone]

## 2023-10-28 NOTE — Progress Notes (Addendum)
1 Day Post-Op Procedure(s) (LRB): XI ROBOTIC ASSISTED PARAESOPHAGEAL HERNIA REPAIR WITH MESH; FUNDAPLICATION (N/A) ESOPHAGOGASTRODUODENOSCOPY (EGD) (N/A) Subjective: Generally feels well  Objective: Vital signs in last 24 hours: Temp:  [97.5 F (36.4 C)-98.4 F (36.9 C)] 98.4 F (36.9 C) (12/05 0500) Pulse Rate:  [55-67] 64 (12/05 0500) Cardiac Rhythm: Normal sinus rhythm (12/04 1901) Resp:  [12-18] 17 (12/05 0500) BP: (135-183)/(65-89) 147/73 (12/05 0500) SpO2:  [88 %-97 %] 94 % (12/05 0500) Arterial Line BP: (157-189)/(71-94) 177/94 (12/04 1330)  Hemodynamic parameters for last 24 hours:    Intake/Output from previous day: 12/04 0701 - 12/05 0700 In: 1525.6 [I.V.:1200.5; IV Piggyback:325.1] Out: 275 [Urine:275] Intake/Output this shift: No intake/output data recorded.  Alert, NAD Lungs: clear Cardiac; RRR Abd: benign Ext : no edema Incis healing well  Lab Results: Recent Labs    10/27/23 1710 10/28/23 0211  WBC 10.2 8.7  HGB 11.8* 11.2*  HCT 36.4 33.5*  PLT 203 175   BMET:  Recent Labs    10/25/23 0949 10/28/23 0211  NA 138 135  K 4.2 4.5  CL 106 103  CO2 25 24  GLUCOSE 100* 124*  BUN 20 17  CREATININE 0.91 0.90  CALCIUM 9.2 8.8*    PT/INR:  Recent Labs    10/25/23 0949  LABPROT 13.8  INR 1.0   ABG No results found for: "PHART", "HCO3", "TCO2", "ACIDBASEDEF", "O2SAT" CBG (last 3)  No results for input(s): "GLUCAP" in the last 72 hours.  Meds Scheduled Meds:  aspirin EC  81 mg Oral QHS   enoxaparin (LOVENOX) injection  40 mg Subcutaneous Q24H   pantoprazole (PROTONIX) IV  40 mg Intravenous QHS   Continuous Infusions:  dextrose 5 % and 0.45 % NaCl Stopped (10/27/23 1654)   PRN Meds:.acetaminophen **OR** acetaminophen, fentaNYL (SUBLIMAZE) injection, hydrALAZINE, ketorolac, ondansetron **OR** ondansetron (ZOFRAN) IV, mouth rinse  Xrays DG Chest 1 View  Result Date: 10/27/2023 CLINICAL DATA:  Status post repair of paraesophageal  hernia. EXAM: CHEST  1 VIEW COMPARISON:  Chest radiograph dated 10/25/2023 and CT dated 10/04/2023. FINDINGS: There is a moderate size left pneumothorax. A small right pneumothorax may be present. There is pneumomediastinum extending up into the neck. There is no pleural effusion. The cardiac silhouette is within normal limits. Extensive right chest wall soft tissue emphysema. IMPRESSION: 1. Moderate size left pneumothorax. 2. Possible small right pneumothorax. 3. Pneumomediastinum. These results will be called to the ordering clinician or representative by the Radiologist Assistant, and communication documented in the PACS or [employer]. Electronically Signed   By: Elgie Collard M.D.   On: 10/27/2023 16:49    Assessment/Plan: S/P Procedure(s) (LRB): XI ROBOTIC ASSISTED PARAESOPHAGEAL HERNIA REPAIR WITH MESH; FUNDAPLICATION (N/A) ESOPHAGOGASTRODUODENOSCOPY (EGD) (N/A)  POD#1  1 afeb, S BP 130's-180's, sinus rhythm 2 O2 sats good on RA 3 voiding- not all measured 4 normal renal fxn 5 minor expected ABLA 6 CXR- Pos sub q air- stable, no pntx 7 swallow this am 8 will get an IS/pulm hygiene 9 routine rehab, poss home later today  LOS: 1 day    Rowe Clack PA-C Pager [phone] 10/28/2023  Agree with above Doing well Swallow today  [name]

## 2023-10-28 NOTE — TOC Transition Note (Signed)
Transition of Care [facility]) - CM/SW Discharge Note   Patient Details  Name: THREASE BATTERSON MRN: [id number] Date of Birth: 1956-06-25  [facility]) CM/SW Contact:  Harriet Masson, RN Phone Number: 10/28/2023, 3:45 PM   Clinical Narrative:    Patient stable for discharge.  No TOC needs at this time.   Final next level of care: Home/Self Care Barriers to Discharge: Barriers Resolved   Patient Goals and CMS Choice    Return home  Discharge Placement               home          Discharge Plan and Services Additional resources added to the After Visit Summary for                                       Social Determinants of Health (SDOH) Interventions SDOH Screenings   Food Insecurity: No Food Insecurity (10/27/2023)  Housing: Low Risk  (10/27/2023)  Transportation Needs: No Transportation Needs (10/27/2023)  Utilities: Not At Risk (10/27/2023)  Tobacco Use: Medium Risk (10/27/2023)     Readmission Risk Interventions    10/28/2023    3:44 PM  Readmission Risk Prevention Plan  Post Dischage Appt Complete  Medication Screening Complete  Transportation Screening Complete

## 2023-10-28 NOTE — Progress Notes (Signed)
Patient tolerating a DYS 1 diet at this time, feels ok to go home. Will let PA know.

## 2023-10-28 NOTE — Care Management Obs Status (Signed)
MEDICARE OBSERVATION STATUS NOTIFICATION   Patient Details  Name: Kristen [name] MRN: [id number] Date of Birth: 02/02/1956   Medicare Observation Status Notification Given:  Yes    Harriet Masson, RN 10/28/2023, 3:17 PM

## 2023-10-29 NOTE — Discharge Summary (Signed)
Physician Discharge Summary       [address]       [place] [address]             [phone]    Patient ID: Kristen [name] MRN: [id number] DOB/AGE: July 31, 1956 67 y.o.  Admit date: 10/27/2023 Discharge date: 10/29/2023  Admission Diagnoses:  Discharge Diagnoses:  Principal Problem:   S/P repair of paraesophageal hernia   Consults: None  Procedure (s): surgery   10/27/2023   Patient:  Kristen [name] Pre-Op Dx: Type IV hiatal hernia   Post-op Dx:  same Procedure: - Esophagoscopy - Robotic assisted laparoscopy - Paraesophageal hernia repair with Ovitex 1S mesh pledgets - Dor Fundoplication     Surgeon and Role:      * Lightfoot, Eliezer Lofts, MD - Primary   Assistant: Webb Laws, PA-C      Referring: [name], DO Primary Care: Mila [place], MD Primary Cardiologist: Lance Muss, MD    History of Present Illness:    Kristen [name] 67 y.o. female presents for surgical evaluation a large type IV hiatal hernia.  She claims that she is minimally symptomatic, but on further questioning, she has made drastic changes to her diet and eating habits over the last several decades.  She eats regular food, but only very small portions.  She does have reflux with tomatoes, but avoids most foods.  She is able to lay flat without any major symptoms.  She denies any odynophagia.  She does have some dysphagia if she eats too fast.  She also has bouts of constipation and diarrhea.  She denies any shortness of breath or respiratory symptoms.    Following full review of the patient and all relevant studies Dr. Cliffton Asters recommended robotic assisted repair of the hiatal hernia.  She was admitted electively for the procedure.  Hospital course:  The patient was admitted electively on 10/27/2023 and taken the operating room at which time she underwent robotic repair of type IV hiatal hernia.  She tolerated the procedure well and was taken to the  postanesthesia care unit in stable condition.    Latest Vital Signs: Blood pressure (!) 144/94, pulse 67, temperature 98.3 F (36.8 C), temperature source Oral, resp. rate 16, height 5\' 3"  (1.6 m), weight 73.5 kg, last menstrual period 11/24/2003, SpO2 95%.  Physical Exam: Alert, NAD Lungs: clear Cardiac; RRR Abd: benign Ext : no edema Incis healing well   Discharge Condition:good  Recent laboratory studies:  Lab Results  Component Value Date   WBC 8.7 10/28/2023   HGB 11.2 (L) 10/28/2023   HCT 33.5 (L) 10/28/2023   MCV 91.3 10/28/2023   PLT 175 10/28/2023   Lab Results  Component Value Date   NA 135 10/28/2023   K 4.5 10/28/2023   CL 103 10/28/2023   CO2 24 10/28/2023   CREATININE 0.90 10/28/2023   GLUCOSE 124 (H) 10/28/2023      Diagnostic Studies: DG ESOPHAGUS W SINGLE CM (SOL OR THIN BA)  Result Date: 10/28/2023 CLINICAL DATA:  Patient is status post paraesophageal hernia repair 10/27/2023. Water-soluble contrast study ordered. EXAM: ESOPHAGUS/BARIUM SWALLOW/TABLET STUDY TECHNIQUE: Single contrast examination was performed using water-soluble contrast. This exam was performed by Alwyn Ren NP, and was supervised and interpreted by Dr. Loreta Ave. FLUOROSCOPY: Radiation Exposure Index (as provided by the fluoroscopic device): 9.10 mGy Kerma COMPARISON:  None Available. FINDINGS: Swallowing: Appears normal. No vestibular penetration or aspiration seen. Pharynx: Unremarkable. Esophagus: Normal appearance. Esophageal motility: Dysmotility with  tertiary contractions. Hiatal Hernia: None. Gastroesophageal reflux: None visualized. Ingested 13mm barium tablet: Not given Other: No evidence of contrast extravasation from the hernia repair site. Air-fluid level noted overlying the mediastinum. This is presumed to be procedure related. IMPRESSION: Dysmotility with tertiary contractions. No evidence of contrast extravasation from the hernia repair site. Air-fluid level noted overlying  the mediastinum which is likely procedure related. Electronically Signed   By: Gilmer Mor D.O.   On: 10/28/2023 12:16   DG Chest 1 View  Result Date: 10/28/2023 CLINICAL DATA:  Status post repair of paraesophageal hernia. EXAM: CHEST  1 VIEW COMPARISON:  October 27, 2023. FINDINGS: Stable cardiomediastinal silhouette. Bibasilar atelectasis is noted. Stable subcutaneous emphysema is noted in the cervical soft tissues as well as the right lateral chest wall. Pneumomediastinum is also noted. Small right pneumothorax is noted. IMPRESSION: Stable pneumomediastinum and subcutaneous emphysema is noted as described above. Small right apical pneumothorax is noted. Bibasilar atelectasis is noted. Electronically Signed   By: Lupita Raider M.D.   On: 10/28/2023 10:46   DG Chest 1 View  Result Date: 10/27/2023 CLINICAL DATA:  Status post repair of paraesophageal hernia. EXAM: CHEST  1 VIEW COMPARISON:  Chest radiograph dated 10/25/2023 and CT dated 10/04/2023. FINDINGS: There is a moderate size left pneumothorax. A small right pneumothorax may be present. There is pneumomediastinum extending up into the neck. There is no pleural effusion. The cardiac silhouette is within normal limits. Extensive right chest wall soft tissue emphysema. IMPRESSION: 1. Moderate size left pneumothorax. 2. Possible small right pneumothorax. 3. Pneumomediastinum. These results will be called to the ordering clinician or representative by the Radiologist Assistant, and communication documented in the PACS or [employer]. Electronically Signed   By: Elgie Collard M.D.   On: 10/27/2023 16:49   DG Chest 2 View  Result Date: 10/26/2023 CLINICAL DATA:  Preop for hiatal hernia repair. EXAM: CHEST - 2 VIEW COMPARISON:  August 12, 2010. FINDINGS: The heart size and mediastinal contours are within normal limits. Large hiatal hernia is again noted. Both lungs are clear. The visualized skeletal structures are unremarkable. IMPRESSION:  No active cardiopulmonary disease.  Large hiatal hernia. Electronically Signed   By: Lupita Raider M.D.   On: 10/26/2023 13:43   CT Chest Wo Contrast  Result Date: 10/21/2023 CLINICAL DATA:  Follow-up lung nodule EXAM: CT CHEST WITHOUT CONTRAST TECHNIQUE: Multidetector CT imaging of the chest was performed following the standard protocol without IV contrast. RADIATION DOSE REDUCTION: This exam was performed according to the departmental dose-optimization program which includes automated exposure control, adjustment of the mA and/or kV according to patient size and/or use of iterative reconstruction technique. COMPARISON:  CT 04/06/2023 FINDINGS: Cardiovascular: Limited assessment without intravenous contrast. Mild aortic atherosclerosis. No aneurysm. Coronary vascular calcification. Normal cardiac size. No pericardial effusion Mediastinum/Nodes: Midline trachea. 2 cm right thyroid nodule series 2, image 9. No suspicious mediastinal lymph nodes. Large hiatal hernia containing intrathoracic inverted stomach, mesentery and colon Lungs/Pleura: No acute airspace disease, pleural effusion or pneumothorax. Mild subpleural reticulation consistent with minimal fibrosis. Multiple pulmonary nodules are again identified. Within the posterior right upper lobe are 2 small adjacent nodules measuring 4 mm each, series 4, image 31, previously 4 mm. Anterior left upper lobe 3 mm pulmonary nodule on series 4 image 47, previously 3 mm. Anterior right upper lobe pulmonary nodule measuring 4 mm on series 4, image 72, previously 4 mm. Right lower lobe pulmonary nodule on series 4, image 113 measures 9 x  9 mm, previously 9 x 9 mm. Sub parole oral left lower lobe pulmonary nodule measures 8 x 7 mm on series 4, image 100, previously 9 x 6 mm. Additional left lower lobe pulmonary nodule on series 4, image 96 measures 7.5 x 6.3 mm, previously 7.6 x 5.7 mm when measured in a similar fashion. Numerous additional pulmonary nodules are  noted. Partial atelectasis in the left lower lobe. Upper Abdomen: No acute finding. Musculoskeletal: No acute osseous abnormality IMPRESSION: 1. Numerous bilateral pulmonary nodules measuring up to 9 mm in the right lower lobe. These are grossly stable as compared with chest CT 04/06/2023. Future CT at 18-24 months (from 04/06/2023 scan) is considered optional for low-risk patients, but is recommended for high-risk patients. This recommendation follows the consensus statement: Guidelines for Management of Incidental Pulmonary Nodules Detected on CT Images: From the Fleischner Society 2017; Radiology 2017; 284:228-243. 2. Large hiatal hernia containing intrathoracic inverted stomach, mesentery and colon. 3. 2 cm right thyroid nodule. Recommend thyroid US (ref: J Am Coll Radiol. 2015 Feb;12(2): 143-50). 4. Aortic atherosclerosis. Aortic Atherosclerosis (ICD10-I70.0). Electronically Signed   By: Jasmine Pang M.D.   On: 10/21/2023 23:00    Results for orders placed or performed during the hospital encounter of 10/27/23 (from the past 48 hour(s))  CBC     Status: Abnormal   Collection Time: 10/27/23  5:10 PM  Result Value Ref Range   WBC 10.2 4.0 - 10.5 K/uL   RBC 3.97 3.87 - 5.11 MIL/uL   Hemoglobin 11.8 (L) 12.0 - 15.0 g/dL   HCT 91.4 78.2 - 95.6 %   MCV 91.7 80.0 - 100.0 fL   MCH 29.7 26.0 - 34.0 pg   MCHC 32.4 30.0 - 36.0 g/dL   RDW 21.3 08.6 - 57.8 %   Platelets 203 150 - 400 K/uL   nRBC 0.0 0.0 - 0.2 %    Comment: Performed at [facility], [address], [place], [place] [address]  Basic metabolic panel     Status: Abnormal   Collection Time: 10/28/23  2:11 AM  Result Value Ref Range   Sodium 135 135 - 145 mmol/L   Potassium 4.5 3.5 - 5.1 mmol/L   Chloride 103 98 - 111 mmol/L   CO2 24 22 - 32 mmol/L   Glucose, Bld 124 (H) 70 - 99 mg/dL    Comment: Glucose reference range applies only to samples taken after fasting for at least 8 hours.   BUN 17 8 - 23 mg/dL   Creatinine, Ser 9.52  0.44 - 1.00 mg/dL   Calcium 8.8 (L) 8.9 - 10.3 mg/dL   GFR, Estimated >84 >13 mL/min    Comment: (NOTE) Calculated using the CKD-EPI Creatinine Equation (2021)    Anion gap 8 5 - 15    Comment: Performed at [facility], [address], [place], [place] [address]  CBC     Status: Abnormal   Collection Time: 10/28/23  2:11 AM  Result Value Ref Range   WBC 8.7 4.0 - 10.5 K/uL   RBC 3.67 (L) 3.87 - 5.11 MIL/uL   Hemoglobin 11.2 (L) 12.0 - 15.0 g/dL   HCT 02.7 (L) 25.3 - 66.4 %   MCV 91.3 80.0 - 100.0 fL   MCH 30.5 26.0 - 34.0 pg   MCHC 33.4 30.0 - 36.0 g/dL   RDW 40.3 47.4 - 25.9 %   Platelets 175 150 - 400 K/uL   nRBC 0.0 0.0 - 0.2 %  Comment: Performed at [facility], [address], [place], [place] [address]      Discharge Instructions     Discharge patient   Complete by: As directed    If no problems with nausea or vomiting after lunch and able to eat somethiing   Discharge disposition: 01-Home or Self Care   Discharge patient date: 10/28/2023       Discharge Medications: Allergies as of 10/28/2023       Reactions   Propoxyphene Swelling   Other Other (See Comments)   All antibiotic causes yeast infection        Medication List     STOP taking these medications    GOODY HEADACHE PO       TAKE these medications    acetaminophen 325 MG tablet Commonly known as: TYLENOL Take 650 mg by mouth every 6 (six) hours as needed for headache.   aspirin EC 81 MG tablet Take 81 mg by mouth at bedtime. Swallow whole.   benazepril 20 MG tablet Commonly known as: LOTENSIN Take 10 mg by mouth daily.   buPROPion 300 MG 24 hr tablet Commonly known as: WELLBUTRIN XL Take 300 mg by mouth daily.   BUTCHERS BROOM PO Take 2 tablets by mouth every morning. 500 mg each   FLUoxetine 40 MG capsule Commonly known as: PROZAC Take 80 mg by mouth daily.   FT RED YEAST RICE PO Take 2 tablets by mouth at bedtime. Red yeast rice 1200 Co-Q 10 100 mg    HYDROcodone-acetaminophen 7.5-325 mg/15 ml solution Commonly known as: HYCET Take 10 mLs by mouth every 6 (six) hours as needed for up to 7 days for moderate pain (pain score 4-6).   NON FORMULARY Place 1 Application into the left ear every 8 (eight) hours. Fuocinolone ear oil   OVER THE COUNTER MEDICATION Take 1 Scoop by mouth daily. Raw organic fiber with 8 oz of water   OVER THE COUNTER MEDICATION Take 1 capsule by mouth daily with supper. Homocysteine factors   OVER THE COUNTER MEDICATION Take 1 capsule by mouth at bedtime. Bifidophilus   OVER THE COUNTER MEDICATION Take 1 tablet by mouth at bedtime. Elderberry d70fense   OVER THE COUNTER MEDICATION Take 1 capsule by mouth daily. Omega co3 se   OVER THE COUNTER MEDICATION Take 5 mLs by mouth at bedtime. Calm Fleet  with 6 oz of water   OVER THE COUNTER MEDICATION Take 3 tablets by mouth at bedtime. Guggul lipids   OVER THE COUNTER MEDICATION Take 2 tablets by mouth 2 (two) times daily. Raw calcium   OVER THE COUNTER MEDICATION Take 1 tablet by mouth 3 (three) times daily. Omegazyme   OVER THE COUNTER MEDICATION Take 2 capsules by mouth every morning. Sinus Support 821 mg each   VITAMIN E PO Take 268 mg by mouth daily.        Follow Up Appointments:  Follow-up Information     Mila [place], MD Follow up in 2 week(s).   Specialty: Family Medicine Why: Hospital follow up Contact information: [address] [place] [address] [phone]                 Signed: Noel Christmas 10/29/2023, 9:41 AM

## 2023-11-01 ENCOUNTER — Other Ambulatory Visit: Payer: Self-pay | Admitting: Emergency Medicine

## 2023-11-01 DIAGNOSIS — R911 Solitary pulmonary nodule: Secondary | ICD-10-CM

## 2023-11-12 ENCOUNTER — Encounter: Payer: Self-pay | Admitting: Thoracic Surgery (Cardiothoracic Vascular Surgery)

## 2023-11-12 ENCOUNTER — Ambulatory Visit (INDEPENDENT_AMBULATORY_CARE_PROVIDER_SITE_OTHER): Payer: Self-pay | Admitting: Thoracic Surgery (Cardiothoracic Vascular Surgery)

## 2023-11-12 VITALS — BP 145/74 | HR 66 | Resp 20 | Ht 63.5 in | Wt 155.2 lb

## 2023-11-12 DIAGNOSIS — Z9889 Other specified postprocedural states: Secondary | ICD-10-CM

## 2023-11-12 DIAGNOSIS — Z8719 Personal history of other diseases of the digestive system: Secondary | ICD-10-CM

## 2023-11-12 DIAGNOSIS — Z1231 Encounter for screening mammogram for malignant neoplasm of breast: Secondary | ICD-10-CM | POA: Diagnosis not present

## 2023-11-12 NOTE — Progress Notes (Signed)
      [address]       [place] [address]             [phone]        [name] [facility] Medical Record #[id number] Date of Birth: 1956-06-12  Referring: Kristen Igo, DO Primary Care: Kristen [place], MD Primary Cardiologist:Kristen Eldridge Dace, MD  Reason for visit:   follow-up  History of Present Illness:     67 year old female presents for first follow-up appointment after undergoing paraesophageal hernia repair.  Overall she is doing well.  She denies any reflux or dysphagia.  Physical Exam: BP (!) 145/74 (BP Location: Right Arm, Patient Position: Sitting, Cuff Size: Normal)   Pulse 66   Resp 20   Ht 5' 3.5" (1.613 m)   Wt 155 lb 3.2 oz (70.4 kg)   LMP 11/24/2003   SpO2 98% Comment: RA  BMI 27.06 kg/m   Alert NAD Incision clean.   Abdomen, ND No peripheral edema     Assessment / Plan:   67 year old female status post paraesophageal hernia repair.  Overall she is doing well.  She will start advancing her diet to fork tender foods.  She will follow-up in 1 month with a chest x-ray.   [name] 11/12/2023 4:33 PM

## 2023-11-26 ENCOUNTER — Ambulatory Visit (INDEPENDENT_AMBULATORY_CARE_PROVIDER_SITE_OTHER): Payer: Self-pay | Admitting: Thoracic Surgery (Cardiothoracic Vascular Surgery)

## 2023-11-26 VITALS — BP 140/74 | HR 59 | Resp 18 | Ht 63.0 in | Wt 154.0 lb

## 2023-11-26 DIAGNOSIS — Z5189 Encounter for other specified aftercare: Secondary | ICD-10-CM

## 2023-11-26 DIAGNOSIS — Z9889 Other specified postprocedural states: Secondary | ICD-10-CM

## 2023-11-26 DIAGNOSIS — Z8719 Personal history of other diseases of the digestive system: Secondary | ICD-10-CM

## 2023-11-26 NOTE — Progress Notes (Signed)
      [address]       [place] [address]             [phone]        ASA FATH [facility] Medical Record #[id number] Date of Birth: [birth year]  Referring: Verena Mems, MD Primary Care: Verena Mems, MD Primary Cardiologist:Jayadeep Dann, MD  Reason for visit:   follow-up  History of Present Illness:     68 year old female presents in follow-up with a complaint of a rash along her inferior most incision.  Otherwise she is doing well.  She is tolerating a diet without any dysphagia.  She does have some belching with liquids.  Physical Exam: BP (!) 140/74   Pulse (!) 59   Resp 18   Ht 5' 3 (1.6 m)   Wt 154 lb (69.9 kg)   LMP 11/24/2003   SpO2 96% Comment: RA  BMI 27.28 kg/m   Alert NAD Incision clean.  There is a small redness, and a rash along the inferior most incision..   Abdomen, ND No peripheral edema       Assessment / Plan:   68 year old female status post paraesophageal hernia repair with a rash along the inferior most incision.  This is only started after she started wearing pants.  I have instructed her to try some Benadryl.  She will follow-up at the end of the month for a regular appointment.    Kristen [name] 11/26/2023 11:55 AM

## 2023-12-14 ENCOUNTER — Other Ambulatory Visit: Payer: Self-pay | Admitting: Thoracic Surgery (Cardiothoracic Vascular Surgery)

## 2023-12-14 DIAGNOSIS — K449 Diaphragmatic hernia without obstruction or gangrene: Secondary | ICD-10-CM

## 2023-12-16 NOTE — Progress Notes (Signed)
      [address]       [place] [address]             [phone]        MALLIE GIAMBRA [facility] Medical Record #[id number] Date of Birth: 11-14-1956  Referring: [name], DO Primary Care: Mila [place], MD Primary Cardiologist:Jayadeep Eldridge Dace, MD  Reason for visit:   follow-up  History of Present Illness:     68yo female presents for her 1 month follow-up.  Overall she is doing well.  She denies any dysphagia.  She occasionally has some heartbrun 3-4 time per week.  Breathing is much improved  Physical Exam: LMP 11/24/2003   Alert NAD Incision clear Rash improved.   Abdomen, ND no peripheral edema   Diagnostic Studies & Laboratory data: CXR: clear     Assessment / Plan:   68 year old female status post paraesophageal hernia repair.  Overall, she is doing well.  She does have some reflux.  She can resume normal activity.  I will follow-up with her in 3 months.     [name] 12/16/2023 12:34 PM

## 2023-12-17 ENCOUNTER — Ambulatory Visit
Admission: RE | Admit: 2023-12-17 | Discharge: 2023-12-17 | Disposition: A | Discharge status: Home / Self Care | Payer: Medicare PPO | Source: Ambulatory Visit | Attending: Thoracic Surgery (Cardiothoracic Vascular Surgery)

## 2023-12-17 ENCOUNTER — Ambulatory Visit (INDEPENDENT_AMBULATORY_CARE_PROVIDER_SITE_OTHER): Payer: Self-pay | Admitting: Thoracic Surgery (Cardiothoracic Vascular Surgery)

## 2023-12-17 ENCOUNTER — Encounter: Payer: Self-pay | Admitting: Thoracic Surgery (Cardiothoracic Vascular Surgery)

## 2023-12-17 VITALS — BP 125/72 | HR 67 | Resp 20 | Wt 148.0 lb

## 2023-12-17 DIAGNOSIS — K449 Diaphragmatic hernia without obstruction or gangrene: Secondary | ICD-10-CM | POA: Diagnosis not present

## 2023-12-17 DIAGNOSIS — Z8719 Personal history of other diseases of the digestive system: Secondary | ICD-10-CM

## 2023-12-17 DIAGNOSIS — Z9889 Other specified postprocedural states: Secondary | ICD-10-CM

## 2023-12-17 DIAGNOSIS — I700 Atherosclerosis of aorta: Secondary | ICD-10-CM | POA: Diagnosis not present

## 2024-02-01 ENCOUNTER — Encounter (HOSPITAL_COMMUNITY): Payer: Self-pay

## 2024-02-01 ENCOUNTER — Encounter (HOSPITAL_COMMUNITY)
Admission: EM | Disposition: A | Discharge status: Home / Self Care | Payer: Self-pay | Source: Home / Self Care | Attending: Thoracic Surgery (Cardiothoracic Vascular Surgery)

## 2024-02-01 ENCOUNTER — Emergency Department (HOSPITAL_COMMUNITY): Admitting: Anesthesiology

## 2024-02-01 ENCOUNTER — Other Ambulatory Visit: Payer: Self-pay

## 2024-02-01 ENCOUNTER — Emergency Department (HOSPITAL_COMMUNITY)

## 2024-02-01 ENCOUNTER — Inpatient Hospital Stay (HOSPITAL_COMMUNITY)
Admission: EM | Admit: 2024-02-01 | Discharge: 2024-02-04 | DRG: 328 | Disposition: A | Discharge status: Home / Self Care | Attending: Thoracic Surgery (Cardiothoracic Vascular Surgery) | Admitting: Thoracic Surgery (Cardiothoracic Vascular Surgery)

## 2024-02-01 DIAGNOSIS — K2100 Gastro-esophageal reflux disease with esophagitis, without bleeding: Secondary | ICD-10-CM | POA: Diagnosis present

## 2024-02-01 DIAGNOSIS — R000 Tachycardia, unspecified: Secondary | ICD-10-CM | POA: Diagnosis not present

## 2024-02-01 DIAGNOSIS — K311 Adult hypertrophic pyloric stenosis: Secondary | ICD-10-CM | POA: Diagnosis not present

## 2024-02-01 DIAGNOSIS — I2510 Atherosclerotic heart disease of native coronary artery without angina pectoris: Secondary | ICD-10-CM

## 2024-02-01 DIAGNOSIS — R1011 Right upper quadrant pain: Secondary | ICD-10-CM | POA: Diagnosis present

## 2024-02-01 DIAGNOSIS — Z8719 Personal history of other diseases of the digestive system: Secondary | ICD-10-CM | POA: Diagnosis not present

## 2024-02-01 DIAGNOSIS — Z8673 Personal history of transient ischemic attack (TIA), and cerebral infarction without residual deficits: Secondary | ICD-10-CM

## 2024-02-01 DIAGNOSIS — I10 Essential (primary) hypertension: Secondary | ICD-10-CM | POA: Diagnosis not present

## 2024-02-01 DIAGNOSIS — Z885 Allergy status to narcotic agent status: Secondary | ICD-10-CM

## 2024-02-01 DIAGNOSIS — Z8249 Family history of ischemic heart disease and other diseases of the circulatory system: Secondary | ICD-10-CM | POA: Diagnosis not present

## 2024-02-01 DIAGNOSIS — Z79899 Other long term (current) drug therapy: Secondary | ICD-10-CM

## 2024-02-01 DIAGNOSIS — Z823 Family history of stroke: Secondary | ICD-10-CM | POA: Diagnosis not present

## 2024-02-01 DIAGNOSIS — K449 Diaphragmatic hernia without obstruction or gangrene: Secondary | ICD-10-CM | POA: Diagnosis not present

## 2024-02-01 DIAGNOSIS — I959 Hypotension, unspecified: Secondary | ICD-10-CM | POA: Diagnosis not present

## 2024-02-01 DIAGNOSIS — K3189 Other diseases of stomach and duodenum: Principal | ICD-10-CM | POA: Diagnosis present

## 2024-02-01 DIAGNOSIS — Z9889 Other specified postprocedural states: Secondary | ICD-10-CM

## 2024-02-01 DIAGNOSIS — R0989 Other specified symptoms and signs involving the circulatory and respiratory systems: Secondary | ICD-10-CM | POA: Diagnosis not present

## 2024-02-01 DIAGNOSIS — R188 Other ascites: Secondary | ICD-10-CM | POA: Diagnosis not present

## 2024-02-01 DIAGNOSIS — Z7982 Long term (current) use of aspirin: Secondary | ICD-10-CM

## 2024-02-01 DIAGNOSIS — F32A Depression, unspecified: Secondary | ICD-10-CM | POA: Diagnosis present

## 2024-02-01 DIAGNOSIS — R1013 Epigastric pain: Secondary | ICD-10-CM | POA: Diagnosis not present

## 2024-02-01 DIAGNOSIS — R933 Abnormal findings on diagnostic imaging of other parts of digestive tract: Secondary | ICD-10-CM | POA: Diagnosis not present

## 2024-02-01 DIAGNOSIS — R1012 Left upper quadrant pain: Secondary | ICD-10-CM | POA: Diagnosis not present

## 2024-02-01 DIAGNOSIS — R140 Abdominal distension (gaseous): Secondary | ICD-10-CM | POA: Diagnosis not present

## 2024-02-01 DIAGNOSIS — K30 Functional dyspepsia: Secondary | ICD-10-CM | POA: Diagnosis not present

## 2024-02-01 DIAGNOSIS — Z4682 Encounter for fitting and adjustment of non-vascular catheter: Secondary | ICD-10-CM | POA: Diagnosis not present

## 2024-02-01 DIAGNOSIS — K5730 Diverticulosis of large intestine without perforation or abscess without bleeding: Secondary | ICD-10-CM | POA: Diagnosis not present

## 2024-02-01 DIAGNOSIS — K562 Volvulus: Secondary | ICD-10-CM | POA: Diagnosis not present

## 2024-02-01 DIAGNOSIS — R079 Chest pain, unspecified: Secondary | ICD-10-CM | POA: Diagnosis not present

## 2024-02-01 DIAGNOSIS — J9811 Atelectasis: Secondary | ICD-10-CM | POA: Diagnosis not present

## 2024-02-01 HISTORY — PX: ESOPHAGOGASTRODUODENOSCOPY: SHX5428

## 2024-02-01 HISTORY — PX: BOWEL DECOMPRESSION: SHX5532

## 2024-02-01 LAB — COMPREHENSIVE METABOLIC PANEL
ALT: 24 U/L (ref 0–44)
AST: 29 U/L (ref 15–41)
Albumin: 4.1 g/dL (ref 3.5–5.0)
Alkaline Phosphatase: 55 U/L (ref 38–126)
Anion gap: 14 (ref 5–15)
BUN: 19 mg/dL (ref 8–23)
CO2: 21 mmol/L — ABNORMAL LOW (ref 22–32)
Calcium: 9 mg/dL (ref 8.9–10.3)
Chloride: 104 mmol/L (ref 98–111)
Creatinine, Ser: 0.72 mg/dL (ref 0.44–1.00)
GFR, Estimated: >60 mL/min (ref >60)
Glucose, Bld: 221 mg/dL — ABNORMAL HIGH (ref 70–99)
Potassium: 3 mmol/L — ABNORMAL LOW (ref 3.5–5.1)
Sodium: 139 mmol/L (ref 135–145)
Total Bilirubin: 1.1 mg/dL (ref 0.0–1.2)
Total Protein: 7.3 g/dL (ref 6.5–8.1)

## 2024-02-01 LAB — LIPASE, BLOOD: Lipase: 20 U/L (ref 11–51)

## 2024-02-01 LAB — CBC WITH DIFFERENTIAL/PLATELET
Abs Immature Granulocytes: 0.04 10*3/uL (ref 0.00–0.07)
Basophils Absolute: 0 10*3/uL (ref 0.0–0.1)
Basophils Relative: 0 %
Eosinophils Absolute: 0 10*3/uL (ref 0.0–0.5)
Eosinophils Relative: 0 %
HCT: 36.7 % (ref 36.0–46.0)
Hemoglobin: 11.8 g/dL — ABNORMAL LOW (ref 12.0–15.0)
Immature Granulocytes: 0 %
Lymphocytes Relative: 11 %
Lymphs Abs: 1 10*3/uL (ref 0.7–4.0)
MCH: 29.5 pg (ref 26.0–34.0)
MCHC: 32.2 g/dL (ref 30.0–36.0)
MCV: 91.8 fL (ref 80.0–100.0)
Monocytes Absolute: 0.3 10*3/uL (ref 0.1–1.0)
Monocytes Relative: 3 %
Neutro Abs: 8.2 10*3/uL — ABNORMAL HIGH (ref 1.7–7.7)
Neutrophils Relative %: 86 %
Platelets: 263 10*3/uL (ref 150–400)
RBC: 4 MIL/uL (ref 3.87–5.11)
RDW: 14.7 % (ref 11.5–15.5)
WBC: 9.6 10*3/uL (ref 4.0–10.5)
nRBC: 0 % (ref 0.0–0.2)

## 2024-02-01 LAB — TROPONIN I (HIGH SENSITIVITY)
Troponin I (High Sensitivity): 57 ng/L — ABNORMAL HIGH (ref <18)
Troponin I (High Sensitivity): 82 ng/L — ABNORMAL HIGH (ref <18)

## 2024-02-01 SURGERY — EGD (ESOPHAGOGASTRODUODENOSCOPY)
Anesthesia: General

## 2024-02-01 MED ORDER — PHENYLEPHRINE HCL (PRESSORS) 10 MG/ML IV SOLN
INTRAVENOUS | Status: DC | PRN
Start: 2024-02-01 — End: 2024-02-01
  Administered 2024-02-01: 160 ug via INTRAVENOUS

## 2024-02-01 MED ORDER — SODIUM CHLORIDE 0.9 % IV BOLUS
500.0000 mL | Freq: Once | INTRAVENOUS | Status: AC
  Administered 2024-02-01: 500 mL via INTRAVENOUS

## 2024-02-01 MED ORDER — MORPHINE SULFATE (PF) 4 MG/ML IV SOLN
4.0000 mg | Freq: Once | INTRAVENOUS | Status: AC
  Administered 2024-02-01: 4 mg via INTRAVENOUS
  Filled 2024-02-01: qty 1

## 2024-02-01 MED ORDER — ONDANSETRON HCL 4 MG/2ML IJ SOLN
4.0000 mg | Freq: Once | INTRAMUSCULAR | Status: AC
  Administered 2024-02-01: 4 mg via INTRAVENOUS
  Filled 2024-02-01: qty 2

## 2024-02-01 MED ORDER — PROPOFOL 10 MG/ML IV BOLUS
INTRAVENOUS | Status: DC | PRN
  Administered 2024-02-01: 150 mg via INTRAVENOUS

## 2024-02-01 MED ORDER — EPHEDRINE SULFATE (PRESSORS) 50 MG/ML IJ SOLN
INTRAMUSCULAR | Status: DC | PRN
  Administered 2024-02-01: 10 mg via INTRAVENOUS

## 2024-02-01 MED ORDER — IOHEXOL 300 MG/ML  SOLN
100.0000 mL | Freq: Once | INTRAMUSCULAR | Status: AC | PRN
  Administered 2024-02-01: 100 mL via INTRAVENOUS

## 2024-02-01 MED ORDER — LACTATED RINGERS IV SOLN: INTRAVENOUS | Status: DC | PRN

## 2024-02-01 MED ORDER — ONDANSETRON 4 MG PO TBDP
4.0000 mg | ORAL_TABLET | Freq: Once | ORAL | Status: AC
  Administered 2024-02-01: 4 mg via ORAL
  Filled 2024-02-01: qty 1

## 2024-02-01 MED ORDER — LIDOCAINE HCL (CARDIAC) PF 100 MG/5ML IV SOSY
PREFILLED_SYRINGE | INTRAVENOUS | Status: DC | PRN
  Administered 2024-02-01: 80 mg via INTRAVENOUS

## 2024-02-01 MED ORDER — ONDANSETRON HCL 4 MG/2ML IJ SOLN
INTRAMUSCULAR | Status: DC | PRN
  Administered 2024-02-01: 4 mg via INTRAVENOUS

## 2024-02-01 MED ORDER — FENTANYL CITRATE (PF) 100 MCG/2ML IJ SOLN
INTRAMUSCULAR | Status: DC | PRN
  Administered 2024-02-01 (×2): 50 ug via INTRAVENOUS

## 2024-02-01 MED ORDER — FENTANYL CITRATE PF 50 MCG/ML IJ SOSY
50.0000 ug | PREFILLED_SYRINGE | Freq: Once | INTRAMUSCULAR | Status: AC
  Administered 2024-02-01: 50 ug via INTRAVENOUS
  Filled 2024-02-01: qty 1

## 2024-02-01 MED ORDER — SUCCINYLCHOLINE CHLORIDE 200 MG/10ML IV SOSY
PREFILLED_SYRINGE | INTRAVENOUS | Status: DC | PRN
  Administered 2024-02-01: 120 mg via INTRAVENOUS

## 2024-02-01 MED ORDER — FENTANYL CITRATE (PF) 100 MCG/2ML IJ SOLN
INTRAMUSCULAR | Status: AC
  Filled 2024-02-01: qty 2

## 2024-02-01 MED ORDER — DEXAMETHASONE SODIUM PHOSPHATE 4 MG/ML IJ SOLN
INTRAMUSCULAR | Status: DC | PRN
  Administered 2024-02-01: 5 mg via INTRAVENOUS

## 2024-02-01 MED ORDER — PROPOFOL 10 MG/ML IV BOLUS
INTRAVENOUS | Status: AC
  Filled 2024-02-01: qty 20

## 2024-02-01 NOTE — Consult Note (Signed)
 Referring Provider: [facility] Primary Care Physician:  Mila Palmer, MD Primary Gastroenterologist:  Dr. Ewing Schlein  Reason for Consultation: Abnormal CT, gastric outlet obstruction versus possible volvulus  HPI: Kristen [name] is a 68 y.o. female with past medical history significant for paraesophageal hernia repair with pledgeted and Dor fundoplication in December 2024 presented to the hospital with abdominal pain, nausea and vomiting.  Symptoms started last night around 8 PM.  Complaining of significant abdominal distention.  Not able to sleep all night. CT abdomen pelvis with IV contrast showed massive gas and fluid filled distention of the stomach with gastric malrotation as well as fluid in the distal esophagus along with wall thickening of esophagus consistent with esophagitis.  Was seen by surgical team.  Has a NG tube in place.  Only minimal symptom improvement with NG tube placement.  Small amount of liquid in the tube but nothing in a container.  Past Medical History:  Diagnosis Date   Abnormal Pap smear of cervix 11/24/2003   colpo bx and conization - normal since   Coronary artery disease    Depression    GERD (gastroesophageal reflux disease)    H/O syncope    History of hiatal hernia    Hypertension    Syncope and collapse 12/14/2008   echo EF 55-60% normal   TIA (transient ischemic attack)    while pregnant    Past Surgical History:  Procedure Laterality Date   BREAST SURGERY Right 11/23/1981   benign biopsy   COLONOSCOPY     DILATION AND CURETTAGE OF UTERUS  11/23/1978   ESOPHAGOGASTRODUODENOSCOPY     ESOPHAGOGASTRODUODENOSCOPY N/A 10/27/2023   Procedure: ESOPHAGOGASTRODUODENOSCOPY (EGD);  Surgeon: [name], MD;  Location: [facility] OR;  Service: Thoracic;  Laterality: N/A;   OTHER SURGICAL HISTORY     benign lumpectomy left   XI ROBOTIC ASSISTED PARAESOPHAGEAL HERNIA REPAIR N/A 10/27/2023   Procedure: XI ROBOTIC ASSISTED PARAESOPHAGEAL HERNIA REPAIR WITH MESH;  Evie Lacks;  Surgeon: [name], MD;  Location: [facility] OR;  Service: Thoracic;  Laterality: N/A;    Prior to Admission medications   Medication Sig Start Date End Date Taking? Authorizing Provider  acetaminophen (TYLENOL) 325 MG tablet Take 650 mg by mouth every 6 (six) hours as needed for headache.    [provider]  aspirin EC 81 MG tablet Take 81 mg by mouth at bedtime. Swallow whole.    [provider]  benazepril (LOTENSIN) 20 MG tablet Take 10 mg by mouth daily. 08/14/23   [provider]  buPROPion (WELLBUTRIN XL) 300 MG 24 hr tablet Take 300 mg by mouth daily.    [provider]  FLUoxetine (PROZAC) 40 MG capsule Take 80 mg by mouth daily. 07/01/21   [provider]  Misc Natural Products (BUTCHERS BROOM PO) Take 2 tablets by mouth every morning. 500 mg each Patient not taking: Reported on 12/17/2023    [provider]  NON FORMULARY Place 1 Application into the left ear every 8 (eight) hours. Fuocinolone ear oil Patient not taking: Reported on 12/17/2023    [provider]  OVER THE COUNTER MEDICATION Take 1 Scoop by mouth daily. Raw organic fiber with 8 oz of water Patient not taking: Reported on 12/17/2023    [provider]  OVER THE COUNTER MEDICATION Take 1 capsule by mouth daily with supper. Homocysteine factors Patient not taking: Reported on 12/17/2023    [provider]  OVER THE COUNTER MEDICATION Take 1 capsule by mouth at  bedtime. Bifidophilus Patient not taking: Reported on 12/17/2023    [provider]  OVER THE COUNTER MEDICATION Take 1 tablet by mouth at bedtime. Elderberry d23fense Patient not taking: Reported on 12/17/2023    [provider]  OVER THE COUNTER MEDICATION Take 1 capsule by mouth daily. Omega co3 se Patient not taking: Reported on 12/17/2023    [provider]  OVER THE COUNTER MEDICATION Take 5 mLs by mouth at bedtime. Calm Fleet  with 6 oz of  water Patient not taking: Reported on 12/17/2023    [provider]  OVER THE COUNTER MEDICATION Take 3 tablets by mouth at bedtime. Guggul lipids Patient not taking: Reported on 12/17/2023    [provider]  OVER THE COUNTER MEDICATION Take 2 tablets by mouth 2 (two) times daily. Raw calcium Patient not taking: Reported on 12/17/2023    [provider]  OVER THE COUNTER MEDICATION Take 1 tablet by mouth 3 (three) times daily. Omegazyme Patient not taking: Reported on 12/17/2023    [provider]  OVER THE COUNTER MEDICATION Take 2 capsules by mouth every morning. Sinus Support 821 mg each Patient not taking: Reported on 12/17/2023    [provider]  Red Yeast Rice Extract (FT RED YEAST RICE PO) Take 2 tablets by mouth at bedtime. Red yeast rice 1200 Co-Q 10 100 mg Patient not taking: Reported on 12/17/2023    [provider]  VITAMIN E PO Take 268 mg by mouth daily. Patient not taking: Reported on 12/17/2023    [provider]    Scheduled Meds: Continuous Infusions: PRN Meds:.  Allergies as of 02/01/2024 - Review Complete 02/01/2024  Allergen Reaction Noted   Propoxyphene Swelling 02/19/2022   Other Other (See Comments) 10/15/2023    Family History  Problem Relation Age of Onset   Heart disease Mother    Arrhythmia Mother        a fib   Breast cancer Mother 68   Heart attack Father 58   Stroke Father    Cancer Maternal Grandmother        colon    Social History   Socioeconomic History   Marital status: Widowed    Spouse name: Not on file   Number of children: Not on file   Years of education: Not on file   Highest education level: Not on file  Occupational History   Not on file  Tobacco Use   Smoking status: Never    Passive exposure: Yes   Smokeless tobacco: Never  Substance and Sexual Activity   Alcohol use: Yes    Comment: 1-2 times a month   Drug use: No   Sexual activity: Not on file  Other  Topics Concern   Not on file  Social History Narrative   Not on file   Social Drivers of Health   Financial Resource Strain: Not on file  Food Insecurity: No Food Insecurity (10/27/2023)   Hunger Vital Sign    Worried About Running Out of Food in the Last Year: Never true    Ran Out of Food in the Last Year: Never true  Transportation Needs: No Transportation Needs (10/27/2023)   PRAPARE - Administrator, Civil Service (Medical): No    Lack of Transportation (Non-Medical): No  Physical Activity: Not on file  Stress: Not on file  Social Connections: Not on file  Intimate Partner Violence: Not At Risk (10/27/2023)   Humiliation, Afraid, Rape, and Kick questionnaire  Fear of Current or Ex-Partner: No    Emotionally Abused: No    Physically Abused: No    Sexually Abused: No    Review of Systems: All negative except as stated above in HPI.  Physical Exam: Vital signs: Vitals:   02/01/24 1515 02/01/24 1708  BP: (!) 181/103   Pulse: 86   Resp: 20   Temp:  98.6 F (37 C)  SpO2: 94%      General: Patient in mild distress, sitting ,  NG tube in place Normocephalic, atraumatic, extraocular movement intact Lungs: No visible respiratory distress Heart:  Regular rate and rhythm; no murmurs, clicks, rubs,  or gallops. Abdomen: Abdomen is fairly distended with generalized discomfort on palpation, bowel sound present, no peritoneal signs Somewhat anxious Alert and oriented x 3 Rectal:  Deferred  GI:  Lab Results: Recent Labs    02/01/24 1151  WBC 9.6  HGB 11.8*  HCT 36.7  PLT 263   BMET Recent Labs    02/01/24 1151  NA 139  K 3.0*  CL 104  CO2 21*  GLUCOSE 221*  BUN 19  CREATININE 0.72  CALCIUM 9.0   LFT Recent Labs    02/01/24 1151  PROT 7.3  ALBUMIN 4.1  AST 29  ALT 24  ALKPHOS 55  BILITOT 1.1   PT/INR No results for input(s): "LABPROT", "INR" in the last 72 hours.   Studies/Results: CT ABDOMEN PELVIS W CONTRAST Result Date:  02/01/2024 CLINICAL DATA:  Left upper abdominal pain. Previous para esophageal hernia repair surgery. EXAM: CT ABDOMEN AND PELVIS WITH CONTRAST TECHNIQUE: Multidetector CT imaging of the abdomen and pelvis was performed using the standard protocol following bolus administration of intravenous contrast. RADIATION DOSE REDUCTION: This exam was performed according to the departmental dose-optimization program which includes automated exposure control, adjustment of the mA and/or kV according to patient size and/or use of iterative reconstruction technique. CONTRAST:  OMNIPAQUE IOHEXOL 300 MG/ML  SOLN COMPARISON:  10/04/2023 and previous FINDINGS: Lower chest: Small moderate fluid in the distal esophagus with mild wall thickening and mucosal enhancement. There is mediastinal fluid collection to the left of the distal esophagus, incompletely visualized. No pleural or pericardial effusion. Nodules in the lateral left lower lobe measuring up to 5 mm(Im7,Se89) , and a 9 mm subpleural nodule in the lateral basal segment right lower lobe image 9, all stable since previous. Hepatobiliary: No focal liver abnormality is seen. No gallstones, gallbladder wall thickening, or biliary dilatation. Pancreas: Unremarkable. No pancreatic ductal dilatation or surrounding inflammatory changes. Spleen: Normal in size without focal abnormality. Adrenals/Urinary Tract: No adrenal mass. Symmetric renal parenchymal enhancement without focal lesion or hydronephrosis. Urinary bladder physiologically distended. Stomach/Bowel: There is massive gas and fluid distention of the stomach, with the fundus directed to the right, and the pylorus retracted back to the left upper quadrant. No wall thickening or pneumatosis. No evidence of obstructing mass. Small bowel is decompressed. Normal appendix. The colon is partially distended, with scattered sigmoid diverticula; no adjacent inflammatory change. Vascular/Lymphatic: Moderate calcified aortoiliac  plaque without aneurysm. Portal vein patent. No abdominal or pelvic adenopathy. Reproductive: Uterus and bilateral adnexa are unremarkable. Other: Trace pelvic fluid.  No abdominal ascites.  No free air. Musculoskeletal: No acute or significant osseous findings. IMPRESSION: 1. Gastric malrotation with dilatation. No evidence of obstructing mass or wall thickening. 2. Fluid in the distal esophagus with mild wall thickening and mucosal enhancement, suggesting reflux esophagitis. 3. Incompletely visualized mediastinal fluid collection to the left of the distal  esophagus. 4. Stable bilateral lower lobe pulmonary nodules measuring up to 9 mm. 5. Sigmoid diverticulosis. Electronically Signed   By: Corlis Leak M.D.   On: 02/01/2024 15:21   DG Chest 2 View Result Date: 02/01/2024 CLINICAL DATA:  Chest pain, left upper quadrant pain EXAM: CHEST - 2 VIEW COMPARISON:  12/17/2023 FINDINGS: Lower lung volumes with mild linear opacities in both lung bases. Heart size and mediastinal contours are within normal limits. Aortic Atherosclerosis (ICD10-170.0). No effusion. Visualized bones unremarkable. IMPRESSION: Low volumes with mild bibasilar atelectasis. Electronically Signed   By: Corlis Leak M.D.   On: 02/01/2024 15:02    Impression/Plan: -Acute onset of abdominal distention, nausea and vomiting.  CT scan concerning for gastric malrotation with likely gastric outlet obstruction as well as fluid filled esophagus with esophagitis.  Concern for gastric volvulus.    Recommendations ------------------------- No significant output with NG tube placement.  Symptoms only minimally improved with NG placement.  She will likely need EGD with decompression as well as endoscopy guided NG tube placement.  -Surgical team is following.  If no improvement with endoscopic decompression, then she will likely need surgical intervention.  Risks (bleeding, infection, bowel perforation that could require surgery, sedation-related changes  in cardiopulmonary systems), benefits (identification and possible treatment of source of symptoms, exclusion of certain causes of symptoms), and alternatives (watchful waiting, radiographic imaging studies, empiric medical treatment)  were explained to patient/family in detail and patient wishes to proceed.     LOS: 0 days   Kathi Der  MD, FACP 02/01/2024, 5:25 PM  Contact #  [phone]

## 2024-02-01 NOTE — H&P (Signed)
      [address]       [place] [address]             [phone]        Dr. Cliffton Asters is aware of patient and accepting transfer and admission to [facility]... Formal note to follow in AM  [name], PA-C 7:32 PM 02/01/24

## 2024-02-01 NOTE — Transfer of Care (Signed)
 Immediate Anesthesia Transfer of Care Note  Patient: Kristen [name]  Procedure(s) Performed: EGD (ESOPHAGOGASTRODUODENOSCOPY) DECOMPRESSION, GASTRIC  Patient Location: PACU  Anesthesia Type:General  Level of Consciousness: awake and alert   Airway & Oxygen Therapy: Patient Spontanous Breathing and Patient connected to face mask oxygen  Post-op Assessment: Report given to RN and Post -op Vital signs reviewed and stable  Post vital signs: Reviewed and stable  Last Vitals:  Vitals Value Taken Time  BP 153/64 02/01/24 1834  Temp    Pulse 88 02/01/24 1838  Resp 22 02/01/24 1838  SpO2 100 % 02/01/24 1838  Vitals shown include unfiled device data.  Last Pain:  Vitals:   02/01/24 1745  TempSrc: Tympanic  PainSc: 10-Worst pain ever         Complications: No notable events documented.

## 2024-02-01 NOTE — H&P (View-Only) (Signed)
 Referring Provider: [facility] Primary Care Physician:  Mila Palmer, MD Primary Gastroenterologist:  Dr. Ewing Schlein  Reason for Consultation: Abnormal CT, gastric outlet obstruction versus possible volvulus  HPI: Kristen [name] is a 68 y.o. female with past medical history significant for paraesophageal hernia repair with pledgeted and Dor fundoplication in December 2024 presented to the hospital with abdominal pain, nausea and vomiting.  Symptoms started last night around 8 PM.  Complaining of significant abdominal distention.  Not able to sleep all night. CT abdomen pelvis with IV contrast showed massive gas and fluid filled distention of the stomach with gastric malrotation as well as fluid in the distal esophagus along with wall thickening of esophagus consistent with esophagitis.  Was seen by surgical team.  Has a NG tube in place.  Only minimal symptom improvement with NG tube placement.  Small amount of liquid in the tube but nothing in a container.  Past Medical History:  Diagnosis Date   Abnormal Pap smear of cervix 11/24/2003   colpo bx and conization - normal since   Coronary artery disease    Depression    GERD (gastroesophageal reflux disease)    H/O syncope    History of hiatal hernia    Hypertension    Syncope and collapse 12/14/2008   echo EF 55-60% normal   TIA (transient ischemic attack)    while pregnant    Past Surgical History:  Procedure Laterality Date   BREAST SURGERY Right 11/23/1981   benign biopsy   COLONOSCOPY     DILATION AND CURETTAGE OF UTERUS  11/23/1978   ESOPHAGOGASTRODUODENOSCOPY     ESOPHAGOGASTRODUODENOSCOPY N/A 10/27/2023   Procedure: ESOPHAGOGASTRODUODENOSCOPY (EGD);  Surgeon: [name], MD;  Location: [facility] OR;  Service: Thoracic;  Laterality: N/A;   OTHER SURGICAL HISTORY     benign lumpectomy left   XI ROBOTIC ASSISTED PARAESOPHAGEAL HERNIA REPAIR N/A 10/27/2023   Procedure: XI ROBOTIC ASSISTED PARAESOPHAGEAL HERNIA REPAIR WITH MESH;  Evie Lacks;  Surgeon: [name], MD;  Location: [facility] OR;  Service: Thoracic;  Laterality: N/A;    Prior to Admission medications   Medication Sig Start Date End Date Taking? Authorizing Provider  acetaminophen (TYLENOL) 325 MG tablet Take 650 mg by mouth every 6 (six) hours as needed for headache.    [provider]  aspirin EC 81 MG tablet Take 81 mg by mouth at bedtime. Swallow whole.    [provider]  benazepril (LOTENSIN) 20 MG tablet Take 10 mg by mouth daily. 08/14/23   [provider]  buPROPion (WELLBUTRIN XL) 300 MG 24 hr tablet Take 300 mg by mouth daily.    [provider]  FLUoxetine (PROZAC) 40 MG capsule Take 80 mg by mouth daily. 07/01/21   [provider]  Misc Natural Products (BUTCHERS BROOM PO) Take 2 tablets by mouth every morning. 500 mg each Patient not taking: Reported on 12/17/2023    [provider]  NON FORMULARY Place 1 Application into the left ear every 8 (eight) hours. Fuocinolone ear oil Patient not taking: Reported on 12/17/2023    [provider]  OVER THE COUNTER MEDICATION Take 1 Scoop by mouth daily. Raw organic fiber with 8 oz of water Patient not taking: Reported on 12/17/2023    [provider]  OVER THE COUNTER MEDICATION Take 1 capsule by mouth daily with supper. Homocysteine factors Patient not taking: Reported on 12/17/2023    [provider]  OVER THE COUNTER MEDICATION Take 1 capsule by mouth at  bedtime. Bifidophilus Patient not taking: Reported on 12/17/2023    [provider]  OVER THE COUNTER MEDICATION Take 1 tablet by mouth at bedtime. Elderberry d23fense Patient not taking: Reported on 12/17/2023    [provider]  OVER THE COUNTER MEDICATION Take 1 capsule by mouth daily. Omega co3 se Patient not taking: Reported on 12/17/2023    [provider]  OVER THE COUNTER MEDICATION Take 5 mLs by mouth at bedtime. Calm Fleet  with 6 oz of  water Patient not taking: Reported on 12/17/2023    [provider]  OVER THE COUNTER MEDICATION Take 3 tablets by mouth at bedtime. Guggul lipids Patient not taking: Reported on 12/17/2023    [provider]  OVER THE COUNTER MEDICATION Take 2 tablets by mouth 2 (two) times daily. Raw calcium Patient not taking: Reported on 12/17/2023    [provider]  OVER THE COUNTER MEDICATION Take 1 tablet by mouth 3 (three) times daily. Omegazyme Patient not taking: Reported on 12/17/2023    [provider]  OVER THE COUNTER MEDICATION Take 2 capsules by mouth every morning. Sinus Support 821 mg each Patient not taking: Reported on 12/17/2023    [provider]  Red Yeast Rice Extract (FT RED YEAST RICE PO) Take 2 tablets by mouth at bedtime. Red yeast rice 1200 Co-Q 10 100 mg Patient not taking: Reported on 12/17/2023    [provider]  VITAMIN E PO Take 268 mg by mouth daily. Patient not taking: Reported on 12/17/2023    [provider]    Scheduled Meds: Continuous Infusions: PRN Meds:.  Allergies as of 02/01/2024 - Review Complete 02/01/2024  Allergen Reaction Noted   Propoxyphene Swelling 02/19/2022   Other Other (See Comments) 10/15/2023    Family History  Problem Relation Age of Onset   Heart disease Mother    Arrhythmia Mother        a fib   Breast cancer Mother 68   Heart attack Father 58   Stroke Father    Cancer Maternal Grandmother        colon    Social History   Socioeconomic History   Marital status: Widowed    Spouse name: Not on file   Number of children: Not on file   Years of education: Not on file   Highest education level: Not on file  Occupational History   Not on file  Tobacco Use   Smoking status: Never    Passive exposure: Yes   Smokeless tobacco: Never  Substance and Sexual Activity   Alcohol use: Yes    Comment: 1-2 times a month   Drug use: No   Sexual activity: Not on file  Other  Topics Concern   Not on file  Social History Narrative   Not on file   Social Drivers of Health   Financial Resource Strain: Not on file  Food Insecurity: No Food Insecurity (10/27/2023)   Hunger Vital Sign    Worried About Running Out of Food in the Last Year: Never true    Ran Out of Food in the Last Year: Never true  Transportation Needs: No Transportation Needs (10/27/2023)   PRAPARE - Administrator, Civil Service (Medical): No    Lack of Transportation (Non-Medical): No  Physical Activity: Not on file  Stress: Not on file  Social Connections: Not on file  Intimate Partner Violence: Not At Risk (10/27/2023)   Humiliation, Afraid, Rape, and Kick questionnaire  Fear of Current or Ex-Partner: No    Emotionally Abused: No    Physically Abused: No    Sexually Abused: No    Review of Systems: All negative except as stated above in HPI.  Physical Exam: Vital signs: Vitals:   02/01/24 1515 02/01/24 1708  BP: (!) 181/103   Pulse: 86   Resp: 20   Temp:  98.6 F (37 C)  SpO2: 94%      General: Patient in mild distress, sitting ,  NG tube in place Normocephalic, atraumatic, extraocular movement intact Lungs: No visible respiratory distress Heart:  Regular rate and rhythm; no murmurs, clicks, rubs,  or gallops. Abdomen: Abdomen is fairly distended with generalized discomfort on palpation, bowel sound present, no peritoneal signs Somewhat anxious Alert and oriented x 3 Rectal:  Deferred  GI:  Lab Results: Recent Labs    02/01/24 1151  WBC 9.6  HGB 11.8*  HCT 36.7  PLT 263   BMET Recent Labs    02/01/24 1151  NA 139  K 3.0*  CL 104  CO2 21*  GLUCOSE 221*  BUN 19  CREATININE 0.72  CALCIUM 9.0   LFT Recent Labs    02/01/24 1151  PROT 7.3  ALBUMIN 4.1  AST 29  ALT 24  ALKPHOS 55  BILITOT 1.1   PT/INR No results for input(s): "LABPROT", "INR" in the last 72 hours.   Studies/Results: CT ABDOMEN PELVIS W CONTRAST Result Date:  02/01/2024 CLINICAL DATA:  Left upper abdominal pain. Previous para esophageal hernia repair surgery. EXAM: CT ABDOMEN AND PELVIS WITH CONTRAST TECHNIQUE: Multidetector CT imaging of the abdomen and pelvis was performed using the standard protocol following bolus administration of intravenous contrast. RADIATION DOSE REDUCTION: This exam was performed according to the departmental dose-optimization program which includes automated exposure control, adjustment of the mA and/or kV according to patient size and/or use of iterative reconstruction technique. CONTRAST:  OMNIPAQUE IOHEXOL 300 MG/ML  SOLN COMPARISON:  10/04/2023 and previous FINDINGS: Lower chest: Small moderate fluid in the distal esophagus with mild wall thickening and mucosal enhancement. There is mediastinal fluid collection to the left of the distal esophagus, incompletely visualized. No pleural or pericardial effusion. Nodules in the lateral left lower lobe measuring up to 5 mm(Im7,Se89) , and a 9 mm subpleural nodule in the lateral basal segment right lower lobe image 9, all stable since previous. Hepatobiliary: No focal liver abnormality is seen. No gallstones, gallbladder wall thickening, or biliary dilatation. Pancreas: Unremarkable. No pancreatic ductal dilatation or surrounding inflammatory changes. Spleen: Normal in size without focal abnormality. Adrenals/Urinary Tract: No adrenal mass. Symmetric renal parenchymal enhancement without focal lesion or hydronephrosis. Urinary bladder physiologically distended. Stomach/Bowel: There is massive gas and fluid distention of the stomach, with the fundus directed to the right, and the pylorus retracted back to the left upper quadrant. No wall thickening or pneumatosis. No evidence of obstructing mass. Small bowel is decompressed. Normal appendix. The colon is partially distended, with scattered sigmoid diverticula; no adjacent inflammatory change. Vascular/Lymphatic: Moderate calcified aortoiliac  plaque without aneurysm. Portal vein patent. No abdominal or pelvic adenopathy. Reproductive: Uterus and bilateral adnexa are unremarkable. Other: Trace pelvic fluid.  No abdominal ascites.  No free air. Musculoskeletal: No acute or significant osseous findings. IMPRESSION: 1. Gastric malrotation with dilatation. No evidence of obstructing mass or wall thickening. 2. Fluid in the distal esophagus with mild wall thickening and mucosal enhancement, suggesting reflux esophagitis. 3. Incompletely visualized mediastinal fluid collection to the left of the distal  esophagus. 4. Stable bilateral lower lobe pulmonary nodules measuring up to 9 mm. 5. Sigmoid diverticulosis. Electronically Signed   By: Corlis Leak M.D.   On: 02/01/2024 15:21   DG Chest 2 View Result Date: 02/01/2024 CLINICAL DATA:  Chest pain, left upper quadrant pain EXAM: CHEST - 2 VIEW COMPARISON:  12/17/2023 FINDINGS: Lower lung volumes with mild linear opacities in both lung bases. Heart size and mediastinal contours are within normal limits. Aortic Atherosclerosis (ICD10-170.0). No effusion. Visualized bones unremarkable. IMPRESSION: Low volumes with mild bibasilar atelectasis. Electronically Signed   By: Corlis Leak M.D.   On: 02/01/2024 15:02    Impression/Plan: -Acute onset of abdominal distention, nausea and vomiting.  CT scan concerning for gastric malrotation with likely gastric outlet obstruction as well as fluid filled esophagus with esophagitis.  Concern for gastric volvulus.    Recommendations ------------------------- No significant output with NG tube placement.  Symptoms only minimally improved with NG placement.  She will likely need EGD with decompression as well as endoscopy guided NG tube placement.  -Surgical team is following.  If no improvement with endoscopic decompression, then she will likely need surgical intervention.  Risks (bleeding, infection, bowel perforation that could require surgery, sedation-related changes  in cardiopulmonary systems), benefits (identification and possible treatment of source of symptoms, exclusion of certain causes of symptoms), and alternatives (watchful waiting, radiographic imaging studies, empiric medical treatment)  were explained to patient/family in detail and patient wishes to proceed.     LOS: 0 days   Kathi Der  MD, FACP 02/01/2024, 5:25 PM  Contact #  [phone]

## 2024-02-01 NOTE — Anesthesia Preprocedure Evaluation (Addendum)
 Anesthesia Evaluation  Patient identified by MRN, date of birth, ID band Patient awake    Reviewed: Allergy & Precautions, NPO status , Patient's Chart, lab work & pertinent test results  History of Anesthesia Complications Negative for: history of anesthetic complications  Airway Mallampati: II  TM Distance: >3 FB Neck ROM: Full    Dental  (+) Dental Advisory Given   Pulmonary neg pulmonary ROS   Pulmonary exam normal        Cardiovascular hypertension, Pt. on medications + CAD  Normal cardiovascular exam     Neuro/Psych  PSYCHIATRIC DISORDERS  Depression    TIA   GI/Hepatic Neg liver ROS, hiatal hernia,GERD  ,,  Endo/Other  negative endocrine ROS    Renal/GU negative Renal ROS     Musculoskeletal negative musculoskeletal ROS (+)    Abdominal   Peds  Hematology negative hematology ROS (+)   Anesthesia Other Findings   Reproductive/Obstetrics                              Anesthesia Physical Anesthesia Plan  ASA: 3 and emergent  Anesthesia Plan: General   Post-op Pain Management: Minimal or no pain anticipated   Induction: Intravenous and Rapid sequence  PONV Risk Score and Plan: 3 and Treatment may vary due to age or medical condition, Ondansetron and Dexamethasone  Airway Management Planned: Oral ETT  Additional Equipment: None  Intra-op Plan:   Post-operative Plan: Extubation in OR  Informed Consent: I have reviewed the patients History and Physical, chart, labs and discussed the procedure including the risks, benefits and alternatives for the proposed anesthesia with the patient or authorized representative who has indicated his/her understanding and acceptance.     Dental advisory given  Plan Discussed with: CRNA and Anesthesiologist  Anesthesia Plan Comments:          Anesthesia Quick Evaluation

## 2024-02-01 NOTE — Consult Note (Signed)
 Kristen [name] August 20, 1956  [id number].    Requesting MD: Felicie Morn, NP Chief Complaint/Reason for Consult: GOO  HPI: Kristen [name] is a 68 y.o. female with a hx of robotic assisted laparoscopy, paraesophageal hernia repair w/ Dor Fundoplication by Dr. Cliffton Asters on 10/27/23 who presented to the ED w/ upper abdominal pain, distension, nausea. Her CT showed a massive gas and fluid distention of the stomach, fluid in the distal esophagus with mild wall thickening and mucosal enhancement and fluid collection to the left of the distal esophagus. WBC 9.6. K 3.0. Cr wnl. Tn 57 > 82. We were asked to see. She is a baby ASA but is otherwise not on blood thinners. We were asked to see.   ROS: ROS As above, see hpi  Family History  Problem Relation Age of Onset   Heart disease Mother    Arrhythmia Mother        a fib   Breast cancer Mother 55   Heart attack Father 17   Stroke Father    Cancer Maternal Grandmother        colon    Past Medical History:  Diagnosis Date   Abnormal Pap smear of cervix 11/24/2003   colpo bx and conization - normal since   Coronary artery disease    Depression    GERD (gastroesophageal reflux disease)    H/O syncope    History of hiatal hernia    Hypertension    Syncope and collapse 12/14/2008   echo EF 55-60% normal   TIA (transient ischemic attack)    while pregnant    Past Surgical History:  Procedure Laterality Date   BREAST SURGERY Right 11/23/1981   benign biopsy   COLONOSCOPY     DILATION AND CURETTAGE OF UTERUS  11/23/1978   ESOPHAGOGASTRODUODENOSCOPY     ESOPHAGOGASTRODUODENOSCOPY N/A 10/27/2023   Procedure: ESOPHAGOGASTRODUODENOSCOPY (EGD);  Surgeon: [name], MD;  Location: [facility] OR;  Service: Thoracic;  Laterality: N/A;   OTHER SURGICAL HISTORY     benign lumpectomy left   XI ROBOTIC ASSISTED PARAESOPHAGEAL HERNIA REPAIR N/A 10/27/2023   Procedure: XI ROBOTIC ASSISTED PARAESOPHAGEAL HERNIA REPAIR WITH MESH;  Evie Lacks;  Surgeon: [name], MD;  Location: [facility] OR;  Service: Thoracic;  Laterality: N/A;    Social History:  reports that she has never smoked. She has been exposed to tobacco smoke. She has never used smokeless tobacco. She reports current alcohol use. She reports that she does not use drugs.  Allergies:  Allergies  Allergen Reactions   Propoxyphene Swelling   Other Other (See Comments)    All antibiotic causes yeast infection    (Not in a hospital admission)    Physical Exam: Blood pressure (!) 181/103, pulse 86, temperature 98.2 F (36.8 C), temperature source Oral, resp. rate 20, height 5\' 3"  (1.6 m), weight 67.1 kg, last menstrual period 11/24/2003, SpO2 94%. General: pleasant, WD/WN female who appears uncomfortable HEENT: head is normocephalic, atraumatic.  Sclera are non-icteric.  Lungs:  Respiratory effort nonlabored Abd:  Soft, moderate distension, upper abdominal ttp, +BS.  MS: no BUE or BLE edema Skin: warm and dry  Psych: A&Ox4 with an appropriate affect Neuro: normal speech, thought process intact, moves all extremities, gait not assessed   Results for orders placed or performed during the hospital encounter of 02/01/24 (from the past 48 hours)  Troponin I (High Sensitivity)     Status: Abnormal   Collection Time: 02/01/24 11:51 AM  Result Value Ref  Range   Troponin I (High Sensitivity) 57 (H) <18 ng/L    Comment: (NOTE) Elevated high sensitivity troponin I (hsTnI) values and significant  changes across serial measurements may suggest ACS but many other  chronic and acute conditions are known to elevate hsTnI results.  Refer to the "Links" section for chest pain algorithms and additional  guidance. Performed at [facility], [address], [place], [place] [address]   Lipase, blood     Status: None   Collection Time: 02/01/24 11:51 AM  Result Value Ref Range   Lipase 20 11 - 51 U/L    Comment: Performed at [facility], [address] [place], [place] [address]  Comprehensive metabolic panel     Status: Abnormal   Collection Time: 02/01/24 11:51 AM  Result Value Ref Range   Sodium 139 135 - 145 mmol/L   Potassium 3.0 (L) 3.5 - 5.1 mmol/L   Chloride 104 98 - 111 mmol/L   CO2 21 (L) 22 - 32 mmol/L   Glucose, Bld 221 (H) 70 - 99 mg/dL    Comment: Glucose reference range applies only to samples taken after fasting for at least 8 hours.   BUN 19 8 - 23 mg/dL   Creatinine, Ser 0.98 0.44 - 1.00 mg/dL   Calcium 9.0 8.9 - 11.9 mg/dL   Total Protein 7.3 6.5 - 8.1 g/dL   Albumin 4.1 3.5 - 5.0 g/dL   AST 29 15 - 41 U/L   ALT 24 0 - 44 U/L   Alkaline Phosphatase 55 38 - 126 U/L   Total Bilirubin 1.1 0.0 - 1.2 mg/dL   GFR, Estimated >14 >78 mL/min    Comment: (NOTE) Calculated using the CKD-EPI Creatinine Equation (2021)    Anion gap 14 5 - 15    Comment: Performed at [facility], [address], [place], [place] [address]  CBC with Differential     Status: Abnormal   Collection Time: 02/01/24 11:51 AM  Result Value Ref Range   WBC 9.6 4.0 - 10.5 K/uL   RBC 4.00 3.87 - 5.11 MIL/uL   Hemoglobin 11.8 (L) 12.0 - 15.0 g/dL   HCT 13.0 86.5 - 78.4 %   MCV 91.8 80.0 - 100.0 fL   MCH 29.5 26.0 - 34.0 pg   MCHC 32.2 30.0 - 36.0 g/dL   RDW 69.6 29.5 - 28.4 %   Platelets 263 150 - 400 K/uL   nRBC 0.0 0.0 - 0.2 %   Neutrophils Relative % 86 %   Neutro Abs 8.2 (H) 1.7 - 7.7 K/uL   Lymphocytes Relative 11 %   Lymphs Abs 1.0 0.7 - 4.0 K/uL   Monocytes Relative 3 %   Monocytes Absolute 0.3 0.1 - 1.0 K/uL   Eosinophils Relative 0 %   Eosinophils Absolute 0.0 0.0 - 0.5 K/uL   Basophils Relative 0 %   Basophils Absolute 0.0 0.0 - 0.1 K/uL   Immature Granulocytes 0 %   Abs Immature Granulocytes 0.04 0.00 - 0.07 K/uL    Comment: Performed at [facility], [address], [place], [place] [address]  Troponin I (High Sensitivity)     Status: Abnormal    Collection Time: 02/01/24  1:41 PM  Result Value Ref Range   Troponin I (High Sensitivity) 82 (H) <18 ng/L    Comment: DELTA CHECK NOTED RESULT CALLED TO, READ BACK BY AND VERIFIED WITH B.MONTEE, RN AT 1518 ON 03.11.25 BY N.THOMPSON (  NOTE) Elevated high sensitivity troponin I (hsTnI) values and significant  changes across serial measurements may suggest ACS but many other  chronic and acute conditions are known to elevate hsTnI results.  Refer to the "Links" section for chest pain algorithms and additional  guidance. Performed at [facility], [address] [place], [place] [address]    DG Chest 2 View Result Date: 02/01/2024 CLINICAL DATA:  Chest pain, left upper quadrant pain EXAM: CHEST - 2 VIEW COMPARISON:  12/17/2023 FINDINGS: Lower lung volumes with mild linear opacities in both lung bases. Heart size and mediastinal contours are within normal limits. Aortic Atherosclerosis (ICD10-170.0). No effusion. Visualized bones unremarkable. IMPRESSION: Low volumes with mild bibasilar atelectasis. Electronically Signed   By: Corlis Leak M.D.   On: 02/01/2024 15:02    CT impression from Radiology IMPRESSION: 1. Gastric malrotation with dilatation. No evidence of obstructing mass or wall thickening. 2. Fluid in the distal esophagus with mild wall thickening and mucosal enhancement, suggesting reflux esophagitis. 3. Incompletely visualized mediastinal fluid collection to the left of the distal esophagus. 4. Stable bilateral lower lobe pulmonary nodules measuring up to 9 mm. 5. Sigmoid diverticulosis.    Anti-infectives (From admission, onward)    None      Assessment/Plan Kristen [name] is a 68 y.o. female with a hx of robotic assisted laparoscopy, paraesophageal hernia repair w/ Dor Fundoplication by Dr. Cliffton Asters on 10/27/23 who presented to the ED w/ upper abdominal pain, distension, nausea. Her CT showed a massive gas and fluid distention of the stomach,  fluid in the distal esophagus with mild wall thickening and mucosal enhancement and fluid collection to the left of the distal esophagus. Recommend NGT for decompression. If unable to decompress, obtain GI consult for endoscopic decompression. Recommend also touching base with [facility] about findings for recommendations given her recent paraesophageal hernia repair. No indication for emergency surgery from our standpoint. We will follow with you.   I reviewed nursing notes, ED provider notes, last 24 h vitals and pain scores, last 48 h intake and output, last 24 h labs and trends, and last 24 h imaging results.  Jacinto Halim, [facility] 02/01/2024, 4:15 PM Please see Amion for pager number during day hours 7:00am-4:30pm

## 2024-02-01 NOTE — Interval H&P Note (Signed)
 History and Physical Interval Note:  68 y.o. female with past medical history significant for paraesophageal hernia repair with pledgeted and Dor fundoplication in December 2024 presented to the hospital with abdominal pain, nausea and vomiting. CT abdomen pelvis with IV contrast showed massive gas and fluid filled distention of the stomach with gastric malrotation as well as fluid in the distal esophagus along with wall thickening of esophagus consistent with esophagitis.   02/01/2024 5:40 PM  Kristen [name]  has presented today for EGD, with the diagnosis of Abnormal CT , Gastric distention.  The various methods of treatment have been discussed with the patient and family. After consideration of risks, benefits and other options for treatment, the patient has consented to  Procedure(s): EGD (ESOPHAGOGASTRODUODENOSCOPY) (N/A) as a surgical intervention.  The patient's history has been reviewed, patient examined, no change in status, stable for surgery.  I have reviewed the patient's chart and labs.  Questions were answered to the patient's satisfaction.     Kerin Salen

## 2024-02-01 NOTE — Anesthesia Postprocedure Evaluation (Signed)
 Anesthesia Post Note  Patient: Kristen [name]  Procedure(s) Performed: EGD (ESOPHAGOGASTRODUODENOSCOPY) DECOMPRESSION, GASTRIC     Patient location during evaluation: PACU Anesthesia Type: General Level of consciousness: awake and alert Pain management: pain level controlled Vital Signs Assessment: post-procedure vital signs reviewed and stable Respiratory status: spontaneous breathing, nonlabored ventilation, respiratory function stable and patient connected to nasal cannula oxygen Cardiovascular status: blood pressure returned to baseline and stable Postop Assessment: no apparent nausea or vomiting Anesthetic complications: no   No notable events documented.  Last Vitals:  Vitals:   02/01/24 1840 02/01/24 1850  BP: (!) 145/70 (!) 145/72  Pulse: 91 95  Resp: (!) 23 16  Temp:    SpO2: 93% 91%    Last Pain:  Vitals:   02/01/24 1850  TempSrc:   PainSc: 0-No pain                 Beryle Lathe

## 2024-02-01 NOTE — Op Note (Signed)
 [facility] Patient Name: Kristen [name] Procedure Date: 02/01/2024 MRN: [id number] Attending MD: Kerin Salen , MD, [id number] Date of Birth: 1955-12-21 CSN: [id number] Age: 68 Admit Type: Outpatient Procedure:                Upper GI endoscopy Indications:              Abnormal CT of the GI tract, massively distended                            gastric cavity suspicious for gastric volvulus Providers:                Kerin Salen, MD, Stephens Shire RN, RN, Kandice Robinsons, Technician Referring MD:             ER Medicines:                Monitored Anesthesia Care Complications:            No immediate complications. Estimated Blood Loss:     Estimated blood loss: none. Procedure:                Pre-Anesthesia Assessment:                           - Prior to the procedure, a History and Physical                            was performed, and patient medications and                            allergies were reviewed. The patient's tolerance of                            previous anesthesia was also reviewed. The risks                            and benefits of the procedure and the sedation                            options and risks were discussed with the patient.                            All questions were answered, and informed consent                            was obtained. Prior Anticoagulants: The patient has                            taken no anticoagulant or antiplatelet agents                            except for aspirin. ASA Grade Assessment: IV - A  patient with severe systemic disease that is a                            constant threat to life. After reviewing the risks                            and benefits, the patient was deemed in                            satisfactory condition to undergo the procedure.                           After obtaining informed consent, the endoscope was                             passed under direct vision. Throughout the                            procedure, the patient's blood pressure, pulse, and                            oxygen saturations were monitored continuously. The                            [id number]-H190 ([id number] Olympus endoscope was introduced                            through the mouth, and advanced to the body of the                            stomach. The upper GI endoscopy was accomplished                            without difficulty. The patient tolerated the                            procedure well. Scope In: Scope Out: Findings:      The examined esophagus was normal.      Massively distended stomach suspicious for gastric volvulus was present       in the cardia, in the gastric fundus and in the gastric body. NG tube       was noted in the gastric body.      A large amount of semi liquid and solid retained food was noted in the       gastric cavity.      A total of 1130 ml of fluid was suctioned and the gastric cavity was       decompressed.      The scope could not be advanced beyond the mid gastric cavity due to       retained food obscuring mucosal visualization. Impression:               - Normal esophagus.                           - Gastric volvulus.1130  ml of gastric fluid was                            suctioned and stomach was decompressed.                           - No specimens collected. Moderate Sedation:      Patient did not receive moderate sedation for this procedure, but       instead received monitored anesthesia care. Recommendation:           - Surgical evaluation now.                           Abdominal Xray now. Procedure Code(s):        --- Professional ---                           [phone], 52, Esophagogastroduodenoscopy, flexible,                            transoral; diagnostic, including collection of                            specimen(s) by brushing or washing, when performed                             (separate procedure) Diagnosis Code(s):        --- Professional ---                           K31.89, Other diseases of stomach and duodenum                           R93.3, Abnormal findings on diagnostic imaging of                            other parts of digestive tract CPT copyright 2022 [employer]. All rights reserved. The codes documented in this report are preliminary and upon coder review may  be revised to meet current compliance requirements. Kerin Salen, MD 02/01/2024 6:28:46 PM This report has been signed electronically. Number of Addenda: 0

## 2024-02-01 NOTE — ED Provider Notes (Signed)
 [place] EMERGENCY DEPARTMENT AT [facility] Provider Note   CSN: [id number] Arrival date & time: 02/01/24  1129     History  Chief Complaint  Patient presents with   Abdominal Pain   Nausea   Diarrhea    Kristen [name] is a 68 y.o. female.  68 year old female with complaint of LUQ pain onset last night with nausea, vomiting and diarrhea. Patient states pain initially was a burning pain that started under her left breast. Pain is now radiating around to her back. She denies chest pain, shortness of breath. Initial troponin of 57, repeat pending. History of hernia repair in December.   Abdominal Pain Pain location:  LUQ Pain quality: gnawing   Pain radiates to:  L flank and back Pain severity:  Severe Onset quality:  Sudden Timing:  Constant Progression:  Worsening Chronicity:  New Context: previous surgery and retching   Associated symptoms: diarrhea   Associated symptoms: no chest pain, no dysuria and no shortness of breath   Diarrhea Quality:  Watery Associated symptoms: abdominal pain        Home Medications Prior to Admission medications   Medication Sig Start Date End Date Taking? Authorizing Provider  acetaminophen (TYLENOL) 325 MG tablet Take 650 mg by mouth every 6 (six) hours as needed for headache.    [provider]  aspirin EC 81 MG tablet Take 81 mg by mouth at bedtime. Swallow whole.    [provider]  benazepril (LOTENSIN) 20 MG tablet Take 10 mg by mouth daily. 08/14/23   [provider]  buPROPion (WELLBUTRIN XL) 300 MG 24 hr tablet Take 300 mg by mouth daily.    [provider]  FLUoxetine (PROZAC) 40 MG capsule Take 80 mg by mouth daily. 07/01/21   [provider]  Misc Natural Products (BUTCHERS BROOM PO) Take 2 tablets by mouth every morning. 500 mg each Patient not taking: Reported on 12/17/2023    [provider]  NON FORMULARY Place 1 Application into the left ear every 8  (eight) hours. Fuocinolone ear oil Patient not taking: Reported on 12/17/2023    [provider]  OVER THE COUNTER MEDICATION Take 1 Scoop by mouth daily. Raw organic fiber with 8 oz of water Patient not taking: Reported on 12/17/2023    [provider]  OVER THE COUNTER MEDICATION Take 1 capsule by mouth daily with supper. Homocysteine factors Patient not taking: Reported on 12/17/2023    [provider]  OVER THE COUNTER MEDICATION Take 1 capsule by mouth at bedtime. Bifidophilus Patient not taking: Reported on 12/17/2023    [provider]  OVER THE COUNTER MEDICATION Take 1 tablet by mouth at bedtime. Elderberry d47fense Patient not taking: Reported on 12/17/2023    [provider]  OVER THE COUNTER MEDICATION Take 1 capsule by mouth daily. Omega co3 se Patient not taking: Reported on 12/17/2023    [provider]  OVER THE COUNTER MEDICATION Take 5 mLs by mouth at bedtime. Calm Fleet  with 6 oz of water Patient not taking: Reported on 12/17/2023    [provider]  OVER THE COUNTER MEDICATION Take 3 tablets by mouth at bedtime. Guggul lipids Patient not taking: Reported on 12/17/2023    [provider]  OVER THE COUNTER MEDICATION Take 2 tablets by mouth 2 (two) times daily. Raw calcium Patient not taking: Reported on 12/17/2023    [provider]  OVER THE COUNTER MEDICATION Take 1 tablet  by mouth 3 (three) times daily. Omegazyme Patient not taking: Reported on 12/17/2023    [provider]  OVER THE COUNTER MEDICATION Take 2 capsules by mouth every morning. Sinus Support 821 mg each Patient not taking: Reported on 12/17/2023    [provider]  Red Yeast Rice Extract (FT RED YEAST RICE PO) Take 2 tablets by mouth at bedtime. Red yeast rice 1200 Co-Q 10 100 mg Patient not taking: Reported on 12/17/2023    [provider]  VITAMIN E PO Take 268 mg by mouth daily. Patient not taking: Reported  on 12/17/2023    [provider]      Allergies    Propoxyphene and Other    Review of Systems   Review of Systems  Respiratory:  Negative for shortness of breath.   Cardiovascular:  Negative for chest pain.  Gastrointestinal:  Positive for abdominal pain and diarrhea.  Genitourinary:  Negative for dysuria.  All other systems reviewed and are negative.   Physical Exam Updated Vital Signs BP (!) 182/85 (BP Location: Left Arm)   Pulse 80   Temp 98.2 F (36.8 C) (Oral)   Resp 16   Ht 5\' 3"  (1.6 m)   Wt 67.1 kg   LMP 11/24/2003   SpO2 100%   BMI 26.20 kg/m  Physical Exam Constitutional:      General: She is in acute distress.  HENT:     Head: Normocephalic.  Eyes:     General: No scleral icterus. Cardiovascular:     Rate and Rhythm: Normal rate.  Pulmonary:     Effort: Pulmonary effort is normal.     Breath sounds: Normal breath sounds.  Abdominal:     General: There is distension.     Palpations: Abdomen is soft.     Tenderness: There is abdominal tenderness in the left upper quadrant.  Skin:    General: Skin is warm and dry.  Neurological:     Mental Status: She is alert and oriented to person, place, and time.  Psychiatric:        Mood and Affect: Mood normal.        Behavior: Behavior normal.    ED Results / Procedures / Treatments   Labs (all labs ordered are listed, but only abnormal results are displayed) Labs Reviewed  COMPREHENSIVE METABOLIC PANEL - Abnormal; Notable for the following components:      Result Value   Potassium 3.0 (*)    CO2 21 (*)    Glucose, Bld 221 (*)    All other components within normal limits  CBC WITH DIFFERENTIAL/PLATELET - Abnormal; Notable for the following components:   Hemoglobin 11.8 (*)    Neutro Abs 8.2 (*)    All other components within normal limits  TROPONIN I (HIGH SENSITIVITY) - Abnormal; Notable for the following components:   Troponin I (High Sensitivity) 57 (*)    All other components within  normal limits  LIPASE, BLOOD  TROPONIN I (HIGH SENSITIVITY)    EKG None  Radiology DG Chest 2 View Result Date: 02/01/2024 CLINICAL DATA:  Chest pain, left upper quadrant pain EXAM: CHEST - 2 VIEW COMPARISON:  12/17/2023 FINDINGS: Lower lung volumes with mild linear opacities in both lung bases. Heart size and mediastinal contours are within normal limits. Aortic Atherosclerosis (ICD10-170.0). No effusion. Visualized bones unremarkable. IMPRESSION: Low volumes with mild bibasilar atelectasis. Electronically Signed   By: Corlis Leak M.D.   On: 02/01/2024 15:02   CLINICAL DATA: Left upper  abdominal pain. Previous para esophageal hernia repair surgery.  EXAM: CT ABDOMEN AND PELVIS WITH CONTRAST  TECHNIQUE: Multidetector CT imaging of the abdomen and pelvis was performed using the standard protocol following bolus administration of intravenous contrast.  RADIATION DOSE REDUCTION: This exam was performed according to the departmental dose-optimization program which includes automated exposure control, adjustment of the mA and/or kV according to patient size and/or use of iterative reconstruction technique.  CONTRAST: OMNIPAQUE IOHEXOL 300 MG/ML SOLN  COMPARISON: 10/04/2023 and previous  FINDINGS: Lower chest: Small moderate fluid in the distal esophagus with mild wall thickening and mucosal enhancement. There is mediastinal fluid collection to the left of the distal esophagus, incompletely visualized. No pleural or pericardial effusion. Nodules in the lateral left lower lobe measuring up to 5 mm(Im7,Se89) , and a 9 mm subpleural nodule in the lateral basal segment right lower lobe image 9, all stable since previous.  Hepatobiliary: No focal liver abnormality is seen. No gallstones, gallbladder wall thickening, or biliary dilatation.  Pancreas: Unremarkable. No pancreatic ductal dilatation or surrounding inflammatory changes.  Spleen: Normal in size without focal  abnormality.  Adrenals/Urinary Tract: No adrenal mass. Symmetric renal parenchymal enhancement without focal lesion or hydronephrosis. Urinary bladder physiologically distended.  Stomach/Bowel: There is massive gas and fluid distention of the stomach, with the fundus directed to the right, and the pylorus retracted back to the left upper quadrant. No wall thickening or pneumatosis. No evidence of obstructing mass. Small bowel is decompressed. Normal appendix. The colon is partially distended, with scattered sigmoid diverticula; no adjacent inflammatory change.  Vascular/Lymphatic: Moderate calcified aortoiliac plaque without aneurysm. Portal vein patent. No abdominal or pelvic adenopathy.  Reproductive: Uterus and bilateral adnexa are unremarkable.  Other: Trace pelvic fluid. No abdominal ascites. No free air.  Musculoskeletal: No acute or significant osseous findings.  IMPRESSION: 1. Gastric malrotation with dilatation. No evidence of obstructing mass or wall thickening. 2. Fluid in the distal esophagus with mild wall thickening and mucosal enhancement, suggesting reflux esophagitis. 3. Incompletely visualized mediastinal fluid collection to the left of the distal esophagus. 4. Stable bilateral lower lobe pulmonary nodules measuring up to 9 mm. 5. Sigmoid diverticulosis.   Electronically Signed By: Corlis Leak M.D. On: 02/01/2024 15:21 Procedures Procedures    Medications Ordered in ED Medications  fentaNYL (SUBLIMAZE) injection 50 mcg (has no administration in time range)  ondansetron (ZOFRAN) injection 4 mg (has no administration in time range)  ondansetron (ZOFRAN-ODT) disintegrating tablet 4 mg (4 mg Oral Given 02/01/24 1311)    ED Course/ Medical Decision Making/ A&P  CT reveals gastric malrotation--surgery consult Labs reveal increasing troponin--cardiology consult  Spoke with cardiology. Not currently concerned with troponin abnormality. Will follow if surgery  requests.  Spoke with Casimiro Needle with general surgery. Recommends placing NG tube and consult with gastroenterology for decompression. Also follow-up with [facility] as they performed recent paraesophageal hernia repair.  Spoke with Dr. Cliffton Asters with [facility]. Reports that GI may safely scope if needed. Will accept patient under his service at [facility] if needed.  Gastroenterology has seen patient and performed endoscopy. Gastric volvulus with 1130 ml fluid suctioned and stomach decompressed. Unable to advance beyond mid-gastric cavity. GI recommended surgical consult.  Admit patient to [facility] under care of Dr. Cliffton Asters. He will see tonight upon her arrival at [facility].  Patient currently waiting for a bed at [facility] PA will place orders. Temporary admission orders have been placed. If patient's condition worsens overnight, please contact Dr. Cliffton Asters [phone]).  Medical Decision Making Amount and/or Complexity of Data Reviewed Labs: ordered. Radiology: ordered.  Risk Prescription drug management. Decision regarding hospitalization.           Final Clinical Impression(s) / ED Diagnoses Final diagnoses:  Acute gastric volvulus    Rx / DC Orders ED Discharge Orders     None         Felicie Morn, NP 02/01/24 2152    Jacalyn Lefevre, MD 02/02/24 [phone]

## 2024-02-01 NOTE — ED Triage Notes (Signed)
 Patient is here for evaluation of left upper quadrant pain for the past few days. Reports that she feels like she needs to throw up, but can't. Pt reports nausea and diarrhea. Complains of back pain.

## 2024-02-01 NOTE — Anesthesia Procedure Notes (Signed)
 Procedure Name: Intubation Date/Time: 02/01/2024 6:07 PM  Performed by: Deri Fuelling, CRNAPre-anesthesia Checklist: Patient identified, Emergency Drugs available, Suction available and Patient being monitored Patient Re-evaluated:Patient Re-evaluated prior to induction Oxygen Delivery Method: Circle system utilized Preoxygenation: Pre-oxygenation with 100% oxygen Induction Type: IV induction Ventilation: Mask ventilation without difficulty Laryngoscope Size: Mac and 3 Grade View: Grade II Tube type: Oral Tube size: 7.0 mm Number of attempts: 1 Airway Equipment and Method: Stylet and Oral airway Placement Confirmation: ETT inserted through vocal cords under direct vision, positive ETCO2 and breath sounds checked- equal and bilateral Secured at: 21 cm Tube secured with: Tape Dental Injury: Teeth and Oropharynx as per pre-operative assessment

## 2024-02-02 ENCOUNTER — Inpatient Hospital Stay (HOSPITAL_COMMUNITY)

## 2024-02-02 ENCOUNTER — Encounter (HOSPITAL_COMMUNITY): Payer: Self-pay | Admitting: Thoracic Surgery (Cardiothoracic Vascular Surgery)

## 2024-02-02 ENCOUNTER — Encounter (HOSPITAL_COMMUNITY)
Admission: EM | Disposition: A | Discharge status: Home / Self Care | Payer: Self-pay | Source: Home / Self Care | Attending: Thoracic Surgery (Cardiothoracic Vascular Surgery)

## 2024-02-02 DIAGNOSIS — K562 Volvulus: Secondary | ICD-10-CM | POA: Diagnosis not present

## 2024-02-02 DIAGNOSIS — K3189 Other diseases of stomach and duodenum: Secondary | ICD-10-CM

## 2024-02-02 DIAGNOSIS — Z9889 Other specified postprocedural states: Secondary | ICD-10-CM

## 2024-02-02 DIAGNOSIS — R1011 Right upper quadrant pain: Secondary | ICD-10-CM | POA: Diagnosis present

## 2024-02-02 DIAGNOSIS — I2510 Atherosclerotic heart disease of native coronary artery without angina pectoris: Secondary | ICD-10-CM

## 2024-02-02 DIAGNOSIS — I10 Essential (primary) hypertension: Secondary | ICD-10-CM

## 2024-02-02 HISTORY — PX: ESOPHAGOGASTRODUODENOSCOPY: SHX5428

## 2024-02-02 HISTORY — PX: XI ROBOTIC ASSISTED HIATAL HERNIA REPAIR: SHX6889

## 2024-02-02 LAB — CBC
HCT: 33.1 % — ABNORMAL LOW (ref 36.0–46.0)
Hemoglobin: 11 g/dL — ABNORMAL LOW (ref 12.0–15.0)
MCH: 30.3 pg (ref 26.0–34.0)
MCHC: 33.2 g/dL (ref 30.0–36.0)
MCV: 91.2 fL (ref 80.0–100.0)
Platelets: 179 10*3/uL (ref 150–400)
RBC: 3.63 MIL/uL — ABNORMAL LOW (ref 3.87–5.11)
RDW: 15.2 % (ref 11.5–15.5)
WBC: 12.8 10*3/uL — ABNORMAL HIGH (ref 4.0–10.5)
nRBC: 0 % (ref 0.0–0.2)

## 2024-02-02 LAB — MAGNESIUM: Magnesium: 1.8 mg/dL (ref 1.7–2.4)

## 2024-02-02 LAB — CREATININE, SERUM
Creatinine, Ser: 0.73 mg/dL (ref 0.44–1.00)
GFR, Estimated: >60 mL/min (ref >60)

## 2024-02-02 SURGERY — REPAIR, HERNIA, HIATAL, ROBOT-ASSISTED
Anesthesia: General | Site: Chest

## 2024-02-02 MED ORDER — ONDANSETRON HCL 4 MG/2ML IJ SOLN
INTRAMUSCULAR | Status: DC | PRN
  Administered 2024-02-02: 4 mg via INTRAVENOUS

## 2024-02-02 MED ORDER — ONDANSETRON HCL 4 MG/2ML IJ SOLN: 4.0000 mg | Freq: Once | INTRAMUSCULAR | Status: DC | PRN

## 2024-02-02 MED ORDER — MORPHINE SULFATE (PF) 2 MG/ML IV SOLN
2.0000 mg | INTRAVENOUS | Status: DC | PRN
  Administered 2024-02-02 – 2024-02-03 (×3): 2 mg via INTRAVENOUS
  Filled 2024-02-02 (×3): qty 1

## 2024-02-02 MED ORDER — 0.9 % SODIUM CHLORIDE (POUR BTL) OPTIME
TOPICAL | Status: DC | PRN
  Administered 2024-02-02: 1000 mL

## 2024-02-02 MED ORDER — ROCURONIUM BROMIDE 10 MG/ML (PF) SYRINGE
PREFILLED_SYRINGE | INTRAVENOUS | Status: AC
  Filled 2024-02-02: qty 10

## 2024-02-02 MED ORDER — LIDOCAINE 2% (20 MG/ML) 5 ML SYRINGE
INTRAMUSCULAR | Status: AC
  Filled 2024-02-02: qty 5

## 2024-02-02 MED ORDER — EPHEDRINE 5 MG/ML INJ
INTRAVENOUS | Status: AC
  Filled 2024-02-02: qty 5

## 2024-02-02 MED ORDER — DEXAMETHASONE SODIUM PHOSPHATE 10 MG/ML IJ SOLN
INTRAMUSCULAR | Status: DC | PRN
  Administered 2024-02-02: 5 mg via INTRAVENOUS

## 2024-02-02 MED ORDER — CHLORHEXIDINE GLUCONATE 0.12 % MT SOLN
15.0000 mL | Freq: Once | OROMUCOSAL | Status: AC
  Administered 2024-02-02: 15 mL via OROMUCOSAL
  Filled 2024-02-02: qty 15

## 2024-02-02 MED ORDER — SUCCINYLCHOLINE CHLORIDE 200 MG/10ML IV SOSY
PREFILLED_SYRINGE | INTRAVENOUS | Status: DC | PRN
  Administered 2024-02-02: 100 mg via INTRAVENOUS

## 2024-02-02 MED ORDER — OXYCODONE HCL 5 MG PO TABS: 5.0000 mg | ORAL_TABLET | Freq: Once | ORAL | Status: DC | PRN

## 2024-02-02 MED ORDER — PHENYLEPHRINE 80 MCG/ML (10ML) SYRINGE FOR IV PUSH (FOR BLOOD PRESSURE SUPPORT)
PREFILLED_SYRINGE | INTRAVENOUS | Status: DC | PRN
  Administered 2024-02-02 (×4): 80 ug via INTRAVENOUS

## 2024-02-02 MED ORDER — ACETAMINOPHEN 10 MG/ML IV SOLN: 1000.0000 mg | Freq: Once | INTRAVENOUS | Status: DC | PRN

## 2024-02-02 MED ORDER — LIDOCAINE 2% (20 MG/ML) 5 ML SYRINGE
INTRAMUSCULAR | Status: DC | PRN
  Administered 2024-02-02: 100 mg via INTRAVENOUS

## 2024-02-02 MED ORDER — BUPIVACAINE LIPOSOME 1.3 % IJ SUSP
INTRAMUSCULAR | Status: AC
  Filled 2024-02-02: qty 20

## 2024-02-02 MED ORDER — PROPOFOL 10 MG/ML IV BOLUS
INTRAVENOUS | Status: AC
  Filled 2024-02-02: qty 20

## 2024-02-02 MED ORDER — PHENYLEPHRINE 80 MCG/ML (10ML) SYRINGE FOR IV PUSH (FOR BLOOD PRESSURE SUPPORT)
PREFILLED_SYRINGE | INTRAVENOUS | Status: AC
  Filled 2024-02-02: qty 10

## 2024-02-02 MED ORDER — ROCURONIUM BROMIDE 10 MG/ML (PF) SYRINGE
PREFILLED_SYRINGE | INTRAVENOUS | Status: DC | PRN
  Administered 2024-02-02: 10 mg via INTRAVENOUS
  Administered 2024-02-02: 50 mg via INTRAVENOUS

## 2024-02-02 MED ORDER — KETOROLAC TROMETHAMINE 15 MG/ML IJ SOLN
15.0000 mg | Freq: Four times a day (QID) | INTRAMUSCULAR | Status: AC
  Administered 2024-02-02 – 2024-02-04 (×8): 15 mg via INTRAVENOUS
  Filled 2024-02-02 (×8): qty 1

## 2024-02-02 MED ORDER — PROPOFOL 10 MG/ML IV BOLUS
INTRAVENOUS | Status: DC | PRN
  Administered 2024-02-02: 120 mg via INTRAVENOUS

## 2024-02-02 MED ORDER — SUCCINYLCHOLINE CHLORIDE 200 MG/10ML IV SOSY
PREFILLED_SYRINGE | INTRAVENOUS | Status: AC
  Filled 2024-02-02: qty 10

## 2024-02-02 MED ORDER — SODIUM CHLORIDE 0.45 % IV SOLN: INTRAVENOUS | Status: DC

## 2024-02-02 MED ORDER — ORAL CARE MOUTH RINSE: 15.0000 mL | Freq: Once | OROMUCOSAL | Status: AC

## 2024-02-02 MED ORDER — ONDANSETRON HCL 4 MG/2ML IJ SOLN
4.0000 mg | Freq: Four times a day (QID) | INTRAMUSCULAR | Status: DC | PRN
Start: 2024-02-02 — End: 2024-02-04

## 2024-02-02 MED ORDER — MIDAZOLAM HCL 2 MG/2ML IJ SOLN
INTRAMUSCULAR | Status: AC
  Filled 2024-02-02: qty 2

## 2024-02-02 MED ORDER — ONDANSETRON HCL 4 MG/2ML IJ SOLN
INTRAMUSCULAR | Status: AC
  Filled 2024-02-02: qty 2

## 2024-02-02 MED ORDER — DEXAMETHASONE SODIUM PHOSPHATE 10 MG/ML IJ SOLN
INTRAMUSCULAR | Status: AC
  Filled 2024-02-02: qty 1

## 2024-02-02 MED ORDER — FENTANYL CITRATE (PF) 250 MCG/5ML IJ SOLN
INTRAMUSCULAR | Status: AC
  Filled 2024-02-02: qty 5

## 2024-02-02 MED ORDER — ENOXAPARIN SODIUM 40 MG/0.4ML IJ SOSY
40.0000 mg | PREFILLED_SYRINGE | INTRAMUSCULAR | Status: DC
  Administered 2024-02-03 – 2024-02-04 (×2): 40 mg via SUBCUTANEOUS
  Filled 2024-02-02 (×2): qty 0.4

## 2024-02-02 MED ORDER — MIDAZOLAM HCL 2 MG/2ML IJ SOLN
INTRAMUSCULAR | Status: DC | PRN
  Administered 2024-02-02: 2 mg via INTRAVENOUS

## 2024-02-02 MED ORDER — MORPHINE SULFATE (PF) 4 MG/ML IV SOLN
4.0000 mg | Freq: Once | INTRAVENOUS | Status: AC
  Administered 2024-02-02: 4 mg via INTRAVENOUS
  Filled 2024-02-02: qty 1

## 2024-02-02 MED ORDER — CEFAZOLIN SODIUM-DEXTROSE 2-3 GM-%(50ML) IV SOLR
INTRAVENOUS | Status: DC | PRN
  Administered 2024-02-02: 2 g via INTRAVENOUS

## 2024-02-02 MED ORDER — FENTANYL CITRATE (PF) 100 MCG/2ML IJ SOLN: 25.0000 ug | INTRAMUSCULAR | Status: DC | PRN

## 2024-02-02 MED ORDER — PHENYLEPHRINE HCL-NACL 20-0.9 MG/250ML-% IV SOLN
INTRAVENOUS | Status: DC | PRN
  Administered 2024-02-02: 20 ug/min via INTRAVENOUS

## 2024-02-02 MED ORDER — CEFAZOLIN SODIUM 1 G IJ SOLR
INTRAMUSCULAR | Status: AC
  Filled 2024-02-02: qty 20

## 2024-02-02 MED ORDER — SUGAMMADEX SODIUM 200 MG/2ML IV SOLN
INTRAVENOUS | Status: AC
  Filled 2024-02-02: qty 2

## 2024-02-02 MED ORDER — LACTATED RINGERS IV SOLN: INTRAVENOUS | Status: DC

## 2024-02-02 MED ORDER — FENTANYL CITRATE (PF) 250 MCG/5ML IJ SOLN
INTRAMUSCULAR | Status: DC | PRN
Start: 2024-02-02 — End: 2024-02-02
  Administered 2024-02-02: 100 ug via INTRAVENOUS
  Administered 2024-02-02: 50 ug via INTRAVENOUS

## 2024-02-02 MED ORDER — SUGAMMADEX SODIUM 200 MG/2ML IV SOLN
INTRAVENOUS | Status: DC | PRN
  Administered 2024-02-02: 200 mg via INTRAVENOUS

## 2024-02-02 MED ORDER — OXYCODONE HCL 5 MG/5ML PO SOLN: 5.0000 mg | Freq: Once | ORAL | Status: DC | PRN

## 2024-02-02 MED ORDER — BUPIVACAINE LIPOSOME 1.3 % IJ SUSP
INTRAMUSCULAR | Status: DC | PRN
  Administered 2024-02-02: 30 mL

## 2024-02-02 MED ORDER — CEFAZOLIN SODIUM-DEXTROSE 2-4 GM/100ML-% IV SOLN
2.0000 g | Freq: Three times a day (TID) | INTRAVENOUS | Status: AC
  Administered 2024-02-02: 2 g via INTRAVENOUS
  Filled 2024-02-02: qty 100

## 2024-02-02 MED ORDER — BUPIVACAINE HCL (PF) 0.5 % IJ SOLN
INTRAMUSCULAR | Status: AC
  Filled 2024-02-02: qty 30

## 2024-02-02 SURGICAL SUPPLY — 53 items
BLADE SURG 11 STRL SS (BLADE) ×2 IMPLANT
CANISTER SUCT 3000ML PPV (MISCELLANEOUS) ×4 IMPLANT
CATH THORACIC 28FR (CATHETERS) IMPLANT
DEFOGGER SCOPE WARMER CLEARIFY (MISCELLANEOUS) ×2 IMPLANT
DERMABOND ADVANCED .7 DNX12 (GAUZE/BANDAGES/DRESSINGS) ×2 IMPLANT
DEVICE SUTURE ENDOST 10MM (ENDOMECHANICALS) IMPLANT
DRAIN PENROSE 12X.25 LTX STRL (MISCELLANEOUS) IMPLANT
DRAPE ARM DVNC X/XI (DISPOSABLE) ×8 IMPLANT
DRAPE COLUMN DVNC XI (DISPOSABLE) ×2 IMPLANT
DRAPE CV SPLIT W-CLR ANES SCRN (DRAPES) ×2 IMPLANT
DRAPE INCISE IOBAN 66X45 STRL (DRAPES) IMPLANT
DRAPE SURG ORHT 6 SPLT 77X108 (DRAPES) ×2 IMPLANT
DRIVER NDL MEGA 8 DVNC XI (INSTRUMENTS) IMPLANT
DRIVER NDL MEGA SUTCUT DVNCXI (INSTRUMENTS) IMPLANT
DRIVER NDLE MEGA DVNC XI (INSTRUMENTS) IMPLANT
DRIVER NDLE MEGA SUTCUT DVNCXI (INSTRUMENTS) ×2 IMPLANT
ELECT REM PT RETURN 9FT ADLT (ELECTROSURGICAL) ×2 IMPLANT
ELECTRODE REM PT RTRN 9FT ADLT (ELECTROSURGICAL) ×2 IMPLANT
FELT TEFLON 1X6 (MISCELLANEOUS) IMPLANT
FORCEPS BPLR LNG DVNC XI (INSTRUMENTS) IMPLANT
FORCEPS CADIERE DVNC XI (FORCEP) IMPLANT
GLOVE BIO SURGEON STRL SZ7.5 (GLOVE) ×6 IMPLANT
GOWN STRL REUS W/ TWL LRG LVL3 (GOWN DISPOSABLE) ×2 IMPLANT
GOWN STRL REUS W/ TWL XL LVL3 (GOWN DISPOSABLE) ×4 IMPLANT
GOWN STRL REUS W/TWL 2XL LVL3 (GOWN DISPOSABLE) ×2 IMPLANT
GRASPER SUT TROCAR 14GX15 (MISCELLANEOUS) IMPLANT
GRASPER TIP-UP FEN DVNC XI (INSTRUMENTS) IMPLANT
IV NS 1000ML BAXH (IV SOLUTION) IMPLANT
KIT BASIN OR (CUSTOM PROCEDURE TRAY) ×2 IMPLANT
KIT TURNOVER KIT B (KITS) ×2 IMPLANT
NS IRRIG 1000ML POUR BTL (IV SOLUTION) ×4 IMPLANT
OBTURATOR OPTICAL STND 8 DVNC (TROCAR) ×2 IMPLANT
OBTURATOR OPTICALSTD 8 DVNC (TROCAR) ×2 IMPLANT
PACK CHEST (CUSTOM PROCEDURE TRAY) ×2 IMPLANT
PAD ARMBOARD 7.5X6 YLW CONV (MISCELLANEOUS) ×4 IMPLANT
PORT ACCESS TROCAR AIRSEAL 12 (TROCAR) IMPLANT
SEAL UNIV 5-12 XI (MISCELLANEOUS) ×8 IMPLANT
SEALER SYNCHRO 8 IS4000 DVNC (MISCELLANEOUS) IMPLANT
SUT ETHIBOND 0 36 GRN (SUTURE) ×4 IMPLANT
SUT SILK 1 MH (SUTURE) ×2 IMPLANT
SUT SURGIDAC NAB ES-9 0 48 120 (SUTURE) IMPLANT
SUT VIC AB 2-0 CT1 TAPERPNT 27 (SUTURE) ×2 IMPLANT
SUT VIC AB 3-0 SH 27X BRD (SUTURE) ×4 IMPLANT
SUT VICRYL 0 UR6 27IN ABS (SUTURE) ×4 IMPLANT
SYSTEM SAHARA CHEST DRAIN ATS (WOUND CARE) IMPLANT
TOWEL GREEN STERILE (TOWEL DISPOSABLE) ×2 IMPLANT
TOWEL GREEN STERILE FF (TOWEL DISPOSABLE) ×2 IMPLANT
TRAY FOLEY MTR SLVR 14FR STAT (SET/KITS/TRAYS/PACK) IMPLANT
TRAY FOLEY MTR SLVR 16FR STAT (SET/KITS/TRAYS/PACK) ×2 IMPLANT
TROCAR PORT AIRSEAL 8X120 (TROCAR) IMPLANT
TROCAR XCEL BLADELESS 5X75MML (TROCAR) ×2 IMPLANT
TROCAR XCEL NON-BLD 5MMX100MML (ENDOMECHANICALS) IMPLANT
WATER STERILE IRR 1000ML POUR (IV SOLUTION) ×2 IMPLANT

## 2024-02-02 NOTE — Anesthesia Preprocedure Evaluation (Addendum)
 Anesthesia Evaluation  Patient identified by MRN, date of birth, ID band Patient awake    Reviewed: Allergy & Precautions, NPO status , Patient's Chart, lab work & pertinent test results, reviewed documented beta blocker date and time   History of Anesthesia Complications Negative for: history of anesthetic complications  Airway Mallampati: III  TM Distance: >3 FB   Mouth opening: Limited Mouth Opening  Dental no notable dental hx.    Pulmonary neg COPD, neg PE   breath sounds clear to auscultation       Cardiovascular hypertension, + CAD  (-) Past MI, (-) Cardiac Stents, (-) CABG and (-) Peripheral Vascular Disease (-) pacemaker(-) Cardiac Defibrillator  Rhythm:Regular Rate:Normal     Neuro/Psych neg Seizures PSYCHIATRIC DISORDERS  Depression    TIA Neuromuscular disease    GI/Hepatic hiatal hernia,GERD  ,,Gastric volvulus s/p NGT   Endo/Other    Renal/GU      Musculoskeletal   Abdominal   Peds  Hematology   Anesthesia Other Findings   Reproductive/Obstetrics                             Anesthesia Physical Anesthesia Plan  ASA: 2  Anesthesia Plan: General   Post-op Pain Management:    Induction: Intravenous  PONV Risk Score and Plan: 2  Airway Management Planned: Double Lumen EBT  Additional Equipment:   Intra-op Plan:   Post-operative Plan: Extubation in OR  Informed Consent: I have reviewed the patients History and Physical, chart, labs and discussed the procedure including the risks, benefits and alternatives for the proposed anesthesia with the patient or authorized representative who has indicated his/her understanding and acceptance.     Dental advisory given  Plan Discussed with: CRNA  Anesthesia Plan Comments:        Anesthesia Quick Evaluation

## 2024-02-02 NOTE — Progress Notes (Signed)
 [facility] Gastroenterology Progress Note  Kristen [name] 68 y.o. 1956-04-06  CC: Gastric volvulus   Subjective: Patient seen and examined at bedside in the [facility] long ED.  Feeling much better compared to yesterday.  Abdominal pain resolved.  Currently being transferred to [place].  ROS : Afebrile, negative for chest pain   Objective: Vital signs in last 24 hours: Vitals:   02/02/24 0636 02/02/24 0800  BP: 123/70 128/70  Pulse: 82 84  Resp: 15 (!) 32  Temp: (!) 97.5 F (36.4 C)   SpO2: 91% 95%    Physical Exam:  Alert and oriented, not in acute distress, NG tube in place Abdominal exam is benign.  Abdomen is soft, nontender, nondistended, bowel sound present, no peritoneal signs. Mood and affect normal Alert and oriented x 3  Lab Results: Recent Labs    02/01/24 1151 02/02/24 0357  NA 139  --   K 3.0*  --   CL 104  --   CO2 21*  --   GLUCOSE 221*  --   BUN 19  --   CREATININE 0.72  --   CALCIUM 9.0  --   MG  --  1.8   Recent Labs    02/01/24 1151  AST 29  ALT 24  ALKPHOS 55  BILITOT 1.1  PROT 7.3  ALBUMIN 4.1   Recent Labs    02/01/24 1151  WBC 9.6  NEUTROABS 8.2*  HGB 11.8*  HCT 36.7  MCV 91.8  PLT 263   No results for input(s): "LABPROT", "INR" in the last 72 hours.    Assessment/Plan: -Acute onset of abdominal distention, nausea and vomiting.  CT scan concerning for gastric malrotation with likely gastric outlet obstruction as well as fluid filled esophagus with esophagitis.  Concern for gastric volvulus.     Recommendations ------------------------- -Underwent EGD with decompression yesterday.  EGD was not completed as not able to advance scope into small bowel because of retained gastric content, limiting visualization. -Further management per general surgery and cardiothoracic surgery.  No further inpatient GI workup planned.  GI will sign off.  Call us back if needed.  Kathi Der MD, FACP 02/02/2024, 9:57 AM  Contact #   [phone]

## 2024-02-02 NOTE — H&P (Signed)
 [address]       [place] [address]             [phone]        Kristen [name] [facility] Medical Record #[id number] Date of Birth: 1956-08-16  Referring: No ref. provider found Primary Care: Mila Palmer, MD Primary Cardiologist:Jayadeep Eldridge Dace, MD  Chief Complaint:    Chief Complaint  Patient presents with   Abdominal Pain   Nausea   Diarrhea   History of Present Illness:      Kristen [name] is well known to [facility].  She is S/P Paraesophageal hernia repair by Dr. Cliffton Asters in December.  Her last visit with Dr. Cliffton Asters was on 1/24 which time she felt she was doing well.  She was continues to experience some reflux at that time.  She presented to the [facility] ED yesterday with complaints of RUQ abdominal pain x 4 days with the urge to vomit but was unable to do so.  CT scan was obtained and showed gastric malrotation and evidence of fluid in and around esophagus.  NG tube was placed for Decompression.  GI consult was obtained who performed upper Endoscopy which showed massive dilatation of stomach concerning for gastric volvulus.  There was also a large amount of retained food particles.  The stomach was decompressed.  The ED physician notified and spoke with Dr. Cliffton Asters who accepted patient for admission and requested transfer to [facility].  The patient removed her NG tube overnight.  She experienced reduced oxygen saturations and was treated with oxygen.      Past Medical History:  Diagnosis Date   Abnormal Pap smear of cervix 11/24/2003   colpo bx and conization - normal since   Coronary artery disease    Depression    GERD (gastroesophageal reflux disease)    H/O syncope    History of hiatal hernia    Hypertension    Syncope and collapse 12/14/2008   echo EF 55-60% normal   TIA (transient ischemic attack)    while pregnant    Past Surgical History:  Procedure Laterality Date   BREAST SURGERY Right 11/23/1981   benign biopsy   COLONOSCOPY      DILATION AND CURETTAGE OF UTERUS  11/23/1978   ESOPHAGOGASTRODUODENOSCOPY     ESOPHAGOGASTRODUODENOSCOPY N/A 10/27/2023   Procedure: ESOPHAGOGASTRODUODENOSCOPY (EGD);  Surgeon: [name], MD;  Location[facility];  Service: Thoracic;  Laterality: N/A;   OTHER SURGICAL HISTORY     benign lumpectomy left   XI ROBOTIC ASSISTED PARAESOPHAGEAL HERNIA REPAIR N/A 10/27/2023   Procedure: XI ROBOTIC ASSISTED PARAESOPHAGEAL HERNIA REPAIR WITH MESH; Evie Lacks;  Surgeon: [name], MD;  Location: [facility] OR;  Service: Thoracic;  Laterality: N/A;    Social History   Tobacco Use  Smoking Status Never   Passive exposure: Yes  Smokeless Tobacco Never    Social History   Substance and Sexual Activity  Alcohol Use Yes   Comment: 1-2 times a month     Allergies  Allergen Reactions   Propoxyphene Swelling   Other Other (See Comments)    All antibiotic causes yeast infection    No current facility-administered medications for this encounter.   Current Outpatient Medications  Medication Sig Dispense Refill   acetaminophen (TYLENOL) 325 MG tablet Take 650 mg by mouth every 6 (six) hours as needed for headache.     aspirin EC 81 MG tablet Take 81 mg by mouth at bedtime. Swallow whole.  benazepril (LOTENSIN) 20 MG tablet Take 10 mg by mouth daily.     buPROPion (WELLBUTRIN XL) 300 MG 24 hr tablet Take 300 mg by mouth daily.     FLUoxetine (PROZAC) 40 MG capsule Take 80 mg by mouth daily.     Misc Natural Products (BUTCHERS BROOM PO) Take 2 tablets by mouth every morning. 500 mg each (Patient not taking: Reported on 12/17/2023)     NON FORMULARY Place 1 Application into the left ear every 8 (eight) hours. Fuocinolone ear oil (Patient not taking: Reported on 12/17/2023)     OVER THE COUNTER MEDICATION Take 1 Scoop by mouth daily. Raw organic fiber with 8 oz of water (Patient not taking: Reported on 12/17/2023)     OVER THE COUNTER MEDICATION Take 1 capsule by mouth daily with  supper. Homocysteine factors (Patient not taking: Reported on 12/17/2023)     OVER THE COUNTER MEDICATION Take 1 capsule by mouth at bedtime. Bifidophilus (Patient not taking: Reported on 12/17/2023)     OVER THE COUNTER MEDICATION Take 1 tablet by mouth at bedtime. Elderberry d75fense (Patient not taking: Reported on 12/17/2023)     OVER THE COUNTER MEDICATION Take 1 capsule by mouth daily. Omega co3 se (Patient not taking: Reported on 12/17/2023)     OVER THE COUNTER MEDICATION Take 5 mLs by mouth at bedtime. Calm Fleet  with 6 oz of water (Patient not taking: Reported on 12/17/2023)     OVER THE COUNTER MEDICATION Take 3 tablets by mouth at bedtime. Guggul lipids (Patient not taking: Reported on 12/17/2023)     OVER THE COUNTER MEDICATION Take 2 tablets by mouth 2 (two) times daily. Raw calcium (Patient not taking: Reported on 12/17/2023)     OVER THE COUNTER MEDICATION Take 1 tablet by mouth 3 (three) times daily. Omegazyme (Patient not taking: Reported on 12/17/2023)     OVER THE COUNTER MEDICATION Take 2 capsules by mouth every morning. Sinus Support 821 mg each (Patient not taking: Reported on 12/17/2023)     Red Yeast Rice Extract (FT RED YEAST RICE PO) Take 2 tablets by mouth at bedtime. Red yeast rice 1200 Co-Q 10 100 mg (Patient not taking: Reported on 12/17/2023)     VITAMIN E PO Take 268 mg by mouth daily. (Patient not taking: Reported on 12/17/2023)      (Not in a hospital admission)   Family History  Problem Relation Age of Onset   Heart disease Mother    Arrhythmia Mother        a fib   Breast cancer Mother 72   Heart attack Father 81   Stroke Father    Cancer Maternal Grandmother        colon     Review of Systems:   Review of Systems  Constitutional: Negative.   Cardiovascular: Negative.   Gastrointestinal: Negative.      Physical Exam: BP 123/70 (BP Location: Right Arm)   Pulse 82   Temp (!) 97.5 F (36.4 C) (Oral)   Resp 15   Ht 5\' 3"  (1.6 m)   Wt 67.1 kg   LMP  11/24/2003   SpO2 91%   BMI 26.20 kg/m    Alert NAD Sinus EWOB Benign abdomen No peripheral edema  Diagnostic Studies & Laboratory data:     Recent Radiology Findings:   DG Abdomen 1 View Result Date: 02/02/2024 CLINICAL DATA:  Nasogastric tube placement EXAM: ABDOMEN - 1 VIEW COMPARISON:  Radiograph and CT from yesterday. FINDINGS: Enteric tube  with tip and side port at the stomach. No small or large bowel dilatation. Stable markings at the lung bases. IMPRESSION: Improved decompression of the stomach. The enteric tube tip and side-port overlaps the stomach. Electronically Signed   By: Tiburcio Pea M.D.   On: 02/02/2024 04:49   DG Abd 1 View Result Date: 02/01/2024 CLINICAL DATA:  Gastric outlet obstruction. EXAM: ABDOMEN - 1 VIEW COMPARISON:  02/01/2024. FINDINGS: The bowel gas pattern is normal. The stomach is distended with gas. An enteric tube terminates in the stomach and appears appropriate in position. Excreted contrast is present in the urinary bladder. IMPRESSION: Mildly distended gas-filled stomach, slightly improved from the prior exam. Enteric tube terminates in the stomach. Electronically Signed   By: Thornell Sartorius M.D.   On: 02/01/2024 21:50   DG Abdomen 1 View Result Date: 02/01/2024 CLINICAL DATA:  NG tube EXAM: ABDOMEN - 1 VIEW COMPARISON:  CT abdomen and pelvis 02/01/2024 the stomach is dilated. FINDINGS: Nasogastric tube extends below the diaphragm and is seen at least to the level of the mid stomach. The distal tip is not included on the image. IMPRESSION: Nasogastric tube extends below the diaphragm and is seen at least to the level of the mid stomach. The distal tip is not included on the image. Repeat imaging recommended to confirm location. Electronically Signed   By: Darliss Cheney M.D.   On: 02/01/2024 19:33   CT ABDOMEN PELVIS W CONTRAST Result Date: 02/01/2024 CLINICAL DATA:  Left upper abdominal pain. Previous para esophageal hernia repair surgery. EXAM: CT  ABDOMEN AND PELVIS WITH CONTRAST TECHNIQUE: Multidetector CT imaging of the abdomen and pelvis was performed using the standard protocol following bolus administration of intravenous contrast. RADIATION DOSE REDUCTION: This exam was performed according to the departmental dose-optimization program which includes automated exposure control, adjustment of the mA and/or kV according to patient size and/or use of iterative reconstruction technique. CONTRAST:  OMNIPAQUE IOHEXOL 300 MG/ML  SOLN COMPARISON:  10/04/2023 and previous FINDINGS: Lower chest: Small moderate fluid in the distal esophagus with mild wall thickening and mucosal enhancement. There is mediastinal fluid collection to the left of the distal esophagus, incompletely visualized. No pleural or pericardial effusion. Nodules in the lateral left lower lobe measuring up to 5 mm(Im7,Se89) , and a 9 mm subpleural nodule in the lateral basal segment right lower lobe image 9, all stable since previous. Hepatobiliary: No focal liver abnormality is seen. No gallstones, gallbladder wall thickening, or biliary dilatation. Pancreas: Unremarkable. No pancreatic ductal dilatation or surrounding inflammatory changes. Spleen: Normal in size without focal abnormality. Adrenals/Urinary Tract: No adrenal mass. Symmetric renal parenchymal enhancement without focal lesion or hydronephrosis. Urinary bladder physiologically distended. Stomach/Bowel: There is massive gas and fluid distention of the stomach, with the fundus directed to the right, and the pylorus retracted back to the left upper quadrant. No wall thickening or pneumatosis. No evidence of obstructing mass. Small bowel is decompressed. Normal appendix. The colon is partially distended, with scattered sigmoid diverticula; no adjacent inflammatory change. Vascular/Lymphatic: Moderate calcified aortoiliac plaque without aneurysm. Portal vein patent. No abdominal or pelvic adenopathy. Reproductive: Uterus and  bilateral adnexa are unremarkable. Other: Trace pelvic fluid.  No abdominal ascites.  No free air. Musculoskeletal: No acute or significant osseous findings. IMPRESSION: 1. Gastric malrotation with dilatation. No evidence of obstructing mass or wall thickening. 2. Fluid in the distal esophagus with mild wall thickening and mucosal enhancement, suggesting reflux esophagitis. 3. Incompletely visualized mediastinal fluid collection to the left of  the distal esophagus. 4. Stable bilateral lower lobe pulmonary nodules measuring up to 9 mm. 5. Sigmoid diverticulosis. Electronically Signed   By: Corlis Leak M.D.   On: 02/01/2024 15:21   DG Chest 2 View Result Date: 02/01/2024 CLINICAL DATA:  Chest pain, left upper quadrant pain EXAM: CHEST - 2 VIEW COMPARISON:  12/17/2023 FINDINGS: Lower lung volumes with mild linear opacities in both lung bases. Heart size and mediastinal contours are within normal limits. Aortic Atherosclerosis (ICD10-170.0). No effusion. Visualized bones unremarkable. IMPRESSION: Low volumes with mild bibasilar atelectasis. Electronically Signed   By: Corlis Leak M.D.   On: 02/01/2024 15:02     I have independently reviewed the above radiologic studies and discussed with the patient   Recent Lab Findings: Lab Results  Component Value Date   WBC 9.6 02/01/2024   HGB 11.8 (L) 02/01/2024   HCT 36.7 02/01/2024   PLT 263 02/01/2024   GLUCOSE 221 (H) 02/01/2024   CHOL 221 (H) 04/05/2013   TRIG 109 04/05/2013   HDL 57 04/05/2013   LDLCALC 142 (H) 04/05/2013   ALT 24 02/01/2024   AST 29 02/01/2024   NA 139 02/01/2024   K 3.0 (L) 02/01/2024   CL 104 02/01/2024   CREATININE 0.72 02/01/2024   BUN 19 02/01/2024   CO2 21 (L) 02/01/2024   INR 1.0 10/25/2023      Assessment / Plan:       S/p Paraesophageal hernia repair in December.. presented with RUQ pain. 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

## 2024-02-02 NOTE — ED Notes (Signed)
 O2 sats maintained 78%-84%. Pt placed on 2L oxygen [place].

## 2024-02-02 NOTE — Plan of Care (Signed)
  Problem: Education: Goal: Knowledge of General Education information will improve Description: Including pain rating scale, medication(s)/side effects and non-pharmacologic comfort measures Outcome: Progressing   Problem: Health Behavior/Discharge Planning: Goal: Ability to manage health-related needs will improve Outcome: Progressing   Problem: Activity: Goal: Risk for activity intolerance will decrease Outcome: Progressing   Problem: Elimination: Goal: Will not experience complications related to urinary retention Outcome: Progressing   Problem: Safety: Goal: Ability to remain free from injury will improve Outcome: Progressing   Problem: Clinical Measurements: Goal: Respiratory complications will improve Outcome: Not Progressing   Problem: Nutrition: Goal: Adequate nutrition will be maintained Outcome: Not Progressing

## 2024-02-02 NOTE — Anesthesia Procedure Notes (Addendum)
 Procedure Name: Intubation Date/Time: 02/02/2024 12:57 PM  Performed by: Camillia Herter, CRNAPre-anesthesia Checklist: Patient identified, Emergency Drugs available, Suction available and Patient being monitored Patient Re-evaluated:Patient Re-evaluated prior to induction Oxygen Delivery Method: Circle System Utilized Preoxygenation: Pre-oxygenation with 100% oxygen Induction Type: IV induction and Rapid sequence Laryngoscope Size: Miller and 2 Grade View: Grade I Tube type: Oral Tube size: 7.0 mm Number of attempts: 1 Airway Equipment and Method: Stylet and Oral airway Placement Confirmation: ETT inserted through vocal cords under direct vision, positive ETCO2 and breath sounds checked- equal and bilateral Secured at: 21 cm Tube secured with: Tape Dental Injury: Teeth and Oropharynx as per pre-operative assessment

## 2024-02-02 NOTE — ED Notes (Addendum)
 NG tube removed by pt. MD aware.

## 2024-02-02 NOTE — Op Note (Signed)
      [address]       [place] [address]             [phone]        02/02/2024  Patient:  Cathleen Corti Pre-Op Dx: Gastric volvulus   History of type IV hiatal hernia Post-op Dx: Same Procedure: - Esophagoscopy - Robotic assisted laparoscopy -Gastropexy   Surgeon and Role:      * Gordy Goar, Eliezer Lofts, MD - Primary  Assistant: Jaclyn Prime, PA-C  An experienced assistant was required given the complexity of this surgery and the standard of surgical care. The assistant was needed for exposure, dissection, suctioning, retraction of delicate tissues and sutures, instrument exchange and for overall help during this procedure.   Anesthesia  general EBL:  10ml Blood Administration: None Specimen: None   Counts: correct   Indications: This patient was admitted to the emergency department left-sided abdominal pain, which excessive bloating.  Cross-sectional imaging showed massively dilated stomach with concern for gastric volvulus.,  An upper endoscopy was performed which partially decompressed the stomach, followed by NG tube placement.  Findings: There is no recurrence in the hiatal, however the fundus of the stomach folded in on itself.  Operative Technique: After the risks, benefits and alternatives were thoroughly discussed, the patient was brought to the operative theatre.  Anesthesia was induced, and the esophagoscope was passed through the oropharynx down to the stomach.  The scope was retroflexed and the hiatal hernia was clearly evident.  The scope was pulled back and the mucosal surface of the esophagus was visualized.    The scope was then parked at 25 cm from the incisors.  The patient was then prepped and draped in normal sterile fashion.  An appropriate surgical pause was performed, and pre-operative antibiotics were dosed accordingly.  We began with a 1 cm incision 15 cm caudad from the xiphoid and slightly lateral to the umbilicus.  Using an  Optiview we entered the peritoneal space.  The abdomen was then insufflated with CO2.  3 other robotic ports were placed to triangulate the hiatus.  Another 12 mm port was placed in place at the level of the umbilicus laterally for an assistant port and another 5 mm trocar was placed in the right lower quadrant for liver retractor.  The patient was then placed in steep reverse Trendelenburg and the liver was elevated to expose the esophageal hiatus.  And then the robot was docked.  We began by evaluating the hiatus and stomach.  There was no evidence of recurrence of the hiatal hernia.  The stomach however was folded on itself.  It appeared viable without evidence of ischemia.  This was confirmed endoscopically as well.  After unfolding of the stomach 3 sutures were used for the gastropexy up to the abdominal wall.  I ensured that there was good continuity via upper endoscopy.  No leak was evident.  The liver retractor was removed and all ports were removed under direct visualization.  The skin and soft tissue were closed with absorbable suture    The patient tolerated the procedure without any immediate complications, and was transferred to the PACU in stable condition.  Lailee Hoelzel Keane Scrape

## 2024-02-02 NOTE — Brief Op Note (Signed)
 02/02/2024  4:01 PM  PATIENT:  Kristen [name]  68 y.o. female  PRE-OPERATIVE DIAGNOSIS:  gastric volvulus  POST-OPERATIVE DIAGNOSIS:  gastric volvulus  PROCEDURE:   ROBOT-ASSISTED LAPAROSCOPY WITH GASTROPEXY  EGD (ESOPHAGOGASTRODUODENOSCOPY)   SURGEON:  Surgeons and Role:    * [name], MD - Primary  PHYSICIAN ASSISTANT: Aloha Gell PA-C, Haywood Lasso PA-S  ASSISTANTS: none   ANESTHESIA:   local and general  EBL:  minimal  BLOOD ADMINISTERED:none  DRAINS: none   LOCAL MEDICATIONS USED:  OTHER Exparel  SPECIMEN:  No Specimen  DISPOSITION OF SPECIMEN:  N/A  COUNTS:  YES  DICTATION: .Dragon Dictation  PLAN OF CARE: Admit to inpatient   PATIENT DISPOSITION:  PACU - hemodynamically stable.   Delay start of Pharmacological VTE agent (>24hrs) due to surgical blood loss or risk of bleeding: no

## 2024-02-02 NOTE — Hospital Course (Signed)
 History of Present Illness:  Kristen [name] is well known to [facility]. She is S/P Paraesophageal hernia repair by Dr. Cliffton Asters in December. Her last visit with Dr. Cliffton Asters was on 1/24 at which time she felt she was doing well. She continued to experience some reflux at that time. She presented to the [facility] ED yesterday with complaints of RUQ abdominal pain x 4 days with the urge to vomit but was unable to do so. CT scan was obtained and showed gastric malrotation and evidence of fluid in and around her esophagus. NG tube was placed for Decompression. GI consult was obtained who performed upper Endoscopy which showed massive dilatation of stomach concerning for gastric volvulus. There was also a large amount of retained food particles. The stomach was decompressed. The ED physician notified and spoke with Dr. Cliffton Asters who accepted patient for admission and requested transfer to [facility].  Prior to transfer the patient removed her NG tube, but did allow a new one to be placed.  Post endoscopy her abdominal pain was resolved and overall she was feeling much better.  Hospital Course:  Patient was brought to the short stay area on arrival to [facility].  She was evaluated by Dr. Cliffton Asters who felt the patient would require surgical intervention.  She was agreeable to proceed.  She was taken to the operating room and underwent Robotic Assisted Gastroplexy and EGD.  She tolerated the procedure without difficulty, was extubated and taken to the PACU in stable condition.  She denied abdominal pain, nausea, vomiting.  Her NG tube was removed without difficulty on POD #1.  She was started on a clear liquid diet.

## 2024-02-02 NOTE — Transfer of Care (Signed)
 Immediate Anesthesia Transfer of Care Note  Patient: Kristen [name]  Procedure(s) Performed: lAPAROSCOPY WITH   ROBOT-ASSISTED WITH GASTROPEXY (Chest) EGD (ESOPHAGOGASTRODUODENOSCOPY) (Abdomen)  Patient Location: PACU  Anesthesia Type:General  Level of Consciousness: awake, alert , and oriented  Airway & Oxygen Therapy: Patient Spontanous Breathing and Patient connected to nasal cannula oxygen  Post-op Assessment: Report given to RN and Post -op Vital signs reviewed and stable  Post vital signs: Reviewed and stable  Last Vitals:  Vitals Value Taken Time  BP 145/83 02/02/24 1500  Temp    Pulse 99 02/02/24 1504  Resp 19 02/02/24 1504  SpO2 88 % 02/02/24 1504  Vitals shown include unfiled device data.  Last Pain:  Vitals:   02/02/24 1019  TempSrc: Oral  PainSc: 0-No pain         Complications: No notable events documented.

## 2024-02-02 NOTE — Progress Notes (Signed)
 1 Day Post-Op  Subjective: CC: S/p EGD and decompression yesterday.  NGT replaced overnight.  She reports resolution of abdominal pain, distension, and nausea. Passing flatus.   Objective: Vital signs in last 24 hours: Temp:  [97.5 F (36.4 C)-98.8 F (37.1 C)] 97.5 F (36.4 C) (03/12 0636) Pulse Rate:  [68-95] 82 (03/12 0636) Resp:  [12-26] 15 (03/12 0636) BP: (118-192)/(56-103) 123/70 (03/12 0636) SpO2:  [89 %-100 %] 91 % (03/12 0636) Weight:  [67.1 kg] 67.1 kg (03/11 1144)    Intake/Output from previous day: 03/11 0701 - 03/12 0700 In: 350 [I.V.:350] Out: -  Intake/Output this shift: No intake/output data recorded.  PE: Gen:  Alert, NAD, pleasant Card:  Reg Pulm:  Rate and effort normal Abd: Soft, mild distension, NGT, +BS. NGT in place - currently clamped as patient about to ambulate to restroom.   Lab Results:  Recent Labs    02/01/24 1151  WBC 9.6  HGB 11.8*  HCT 36.7  PLT 263   BMET Recent Labs    02/01/24 1151  NA 139  K 3.0*  CL 104  CO2 21*  GLUCOSE 221*  BUN 19  CREATININE 0.72  CALCIUM 9.0   PT/INR No results for input(s): "LABPROT", "INR" in the last 72 hours. CMP     Component Value Date/Time   NA 139 02/01/2024 1151   K 3.0 (L) 02/01/2024 1151   CL 104 02/01/2024 1151   CO2 21 (L) 02/01/2024 1151   GLUCOSE 221 (H) 02/01/2024 1151   BUN 19 02/01/2024 1151   CREATININE 0.72 02/01/2024 1151   CALCIUM 9.0 02/01/2024 1151   PROT 7.3 02/01/2024 1151   ALBUMIN 4.1 02/01/2024 1151   AST 29 02/01/2024 1151   ALT 24 02/01/2024 1151   ALKPHOS 55 02/01/2024 1151   BILITOT 1.1 02/01/2024 1151   GFRNONAA >60 02/01/2024 1151   GFRAA  08/12/2010 1543    >60        The eGFR has been calculated using the MDRD equation. This calculation has not been validated in all clinical situations. eGFR's persistently <60 mL/min signify possible Chronic Kidney Disease.   Lipase     Component Value Date/Time   LIPASE 20 02/01/2024 1151     Studies/Results: DG Abdomen 1 View Result Date: 02/02/2024 CLINICAL DATA:  Nasogastric tube placement EXAM: ABDOMEN - 1 VIEW COMPARISON:  Radiograph and CT from yesterday. FINDINGS: Enteric tube with tip and side port at the stomach. No small or large bowel dilatation. Stable markings at the lung bases. IMPRESSION: Improved decompression of the stomach. The enteric tube tip and side-port overlaps the stomach. Electronically Signed   By: Tiburcio Pea M.D.   On: 02/02/2024 04:49   DG Abd 1 View Result Date: 02/01/2024 CLINICAL DATA:  Gastric outlet obstruction. EXAM: ABDOMEN - 1 VIEW COMPARISON:  02/01/2024. FINDINGS: The bowel gas pattern is normal. The stomach is distended with gas. An enteric tube terminates in the stomach and appears appropriate in position. Excreted contrast is present in the urinary bladder. IMPRESSION: Mildly distended gas-filled stomach, slightly improved from the prior exam. Enteric tube terminates in the stomach. Electronically Signed   By: Thornell Sartorius M.D.   On: 02/01/2024 21:50   DG Abdomen 1 View Result Date: 02/01/2024 CLINICAL DATA:  NG tube EXAM: ABDOMEN - 1 VIEW COMPARISON:  CT abdomen and pelvis 02/01/2024 the stomach is dilated. FINDINGS: Nasogastric tube extends below the diaphragm and is seen at least to the level of the mid stomach.  The distal tip is not included on the image. IMPRESSION: Nasogastric tube extends below the diaphragm and is seen at least to the level of the mid stomach. The distal tip is not included on the image. Repeat imaging recommended to confirm location. Electronically Signed   By: Darliss Cheney M.D.   On: 02/01/2024 19:33   CT ABDOMEN PELVIS W CONTRAST Result Date: 02/01/2024 CLINICAL DATA:  Left upper abdominal pain. Previous para esophageal hernia repair surgery. EXAM: CT ABDOMEN AND PELVIS WITH CONTRAST TECHNIQUE: Multidetector CT imaging of the abdomen and pelvis was performed using the standard protocol following bolus  administration of intravenous contrast. RADIATION DOSE REDUCTION: This exam was performed according to the departmental dose-optimization program which includes automated exposure control, adjustment of the mA and/or kV according to patient size and/or use of iterative reconstruction technique. CONTRAST:  OMNIPAQUE IOHEXOL 300 MG/ML  SOLN COMPARISON:  10/04/2023 and previous FINDINGS: Lower chest: Small moderate fluid in the distal esophagus with mild wall thickening and mucosal enhancement. There is mediastinal fluid collection to the left of the distal esophagus, incompletely visualized. No pleural or pericardial effusion. Nodules in the lateral left lower lobe measuring up to 5 mm(Im7,Se89) , and a 9 mm subpleural nodule in the lateral basal segment right lower lobe image 9, all stable since previous. Hepatobiliary: No focal liver abnormality is seen. No gallstones, gallbladder wall thickening, or biliary dilatation. Pancreas: Unremarkable. No pancreatic ductal dilatation or surrounding inflammatory changes. Spleen: Normal in size without focal abnormality. Adrenals/Urinary Tract: No adrenal mass. Symmetric renal parenchymal enhancement without focal lesion or hydronephrosis. Urinary bladder physiologically distended. Stomach/Bowel: There is massive gas and fluid distention of the stomach, with the fundus directed to the right, and the pylorus retracted back to the left upper quadrant. No wall thickening or pneumatosis. No evidence of obstructing mass. Small bowel is decompressed. Normal appendix. The colon is partially distended, with scattered sigmoid diverticula; no adjacent inflammatory change. Vascular/Lymphatic: Moderate calcified aortoiliac plaque without aneurysm. Portal vein patent. No abdominal or pelvic adenopathy. Reproductive: Uterus and bilateral adnexa are unremarkable. Other: Trace pelvic fluid.  No abdominal ascites.  No free air. Musculoskeletal: No acute or significant osseous findings.  IMPRESSION: 1. Gastric malrotation with dilatation. No evidence of obstructing mass or wall thickening. 2. Fluid in the distal esophagus with mild wall thickening and mucosal enhancement, suggesting reflux esophagitis. 3. Incompletely visualized mediastinal fluid collection to the left of the distal esophagus. 4. Stable bilateral lower lobe pulmonary nodules measuring up to 9 mm. 5. Sigmoid diverticulosis. Electronically Signed   By: Corlis Leak M.D.   On: 02/01/2024 15:21   DG Chest 2 View Result Date: 02/01/2024 CLINICAL DATA:  Chest pain, left upper quadrant pain EXAM: CHEST - 2 VIEW COMPARISON:  12/17/2023 FINDINGS: Lower lung volumes with mild linear opacities in both lung bases. Heart size and mediastinal contours are within normal limits. Aortic Atherosclerosis (ICD10-170.0). No effusion. Visualized bones unremarkable. IMPRESSION: Low volumes with mild bibasilar atelectasis. Electronically Signed   By: Corlis Leak M.D.   On: 02/01/2024 15:02    Anti-infectives: Anti-infectives (From admission, onward)    None        Assessment/Plan ELAIJAH MUNOZ is a 68 y.o. female with a hx of robotic assisted laparoscopy, paraesophageal hernia repair with Ovitex 1S mesh pledgets and Dor Fundoplication by Dr. Cliffton Asters on 10/27/23 who presented to the ED w/ upper abdominal pain, distension, nausea. Her CT 3/11 showed a massive gas and fluid distention of the stomach, fluid in  the distal esophagus with mild wall thickening and mucosal enhancement and fluid collection to the left of the distal esophagus. She underwent EGD 3/11 by Dr. Marca Ancona w/ massively distended stomach with evacuation of 1130 ml of fluid. There was concerns on imaging for gastric malrotation/volvulus. During EGD the scope could not be advanced beyond the mid gastric cavity due to retained food obscuring mucosal visualization. After NGT placement her symptoms have resolved. She is currently afebrile with tachycardia or hypotension. No labs  done today. WBC wnl yesterday. She is completely NT on exam.  No indication for emergency surgery from our standpoint. Can get UGI via NGT to further eval. She has been admitted to [facility] and is awaiting transfer to [facility] for evaluation and further recommendations given her recent paraesophageal hernia repair with their team. She appears to be posted for OR today w/ their team per chart review.   FEN: NPO, NGT to LIWS. IVF per primary  VTE: SCDs, okay for chem ppx from our standpoint ID: None  I reviewed nursing notes, ED provider notes, Consultant ([facility], GI) notes, last 24 h vitals and pain scores, last 48 h intake and output, last 24 h labs and trends, and last 24 h imaging results.   LOS: 1 day    Jacinto Halim, [facility] Surgery 02/02/2024, 8:51 AM Please see Amion for pager number during day hours 7:00am-4:30pm

## 2024-02-03 ENCOUNTER — Encounter (HOSPITAL_COMMUNITY): Payer: Self-pay | Admitting: Thoracic Surgery (Cardiothoracic Vascular Surgery)

## 2024-02-03 LAB — BASIC METABOLIC PANEL
Anion gap: 8 (ref 5–15)
BUN: 23 mg/dL (ref 8–23)
CO2: 25 mmol/L (ref 22–32)
Calcium: 8.4 mg/dL — ABNORMAL LOW (ref 8.9–10.3)
Chloride: 109 mmol/L (ref 98–111)
Creatinine, Ser: 0.79 mg/dL (ref 0.44–1.00)
GFR, Estimated: >60 mL/min (ref >60)
Glucose, Bld: 127 mg/dL — ABNORMAL HIGH (ref 70–99)
Potassium: 4 mmol/L (ref 3.5–5.1)
Sodium: 142 mmol/L (ref 135–145)

## 2024-02-03 LAB — CBC
HCT: 30.4 % — ABNORMAL LOW (ref 36.0–46.0)
Hemoglobin: 9.8 g/dL — ABNORMAL LOW (ref 12.0–15.0)
MCH: 29.8 pg (ref 26.0–34.0)
MCHC: 32.2 g/dL (ref 30.0–36.0)
MCV: 92.4 fL (ref 80.0–100.0)
Platelets: 164 10*3/uL (ref 150–400)
RBC: 3.29 MIL/uL — ABNORMAL LOW (ref 3.87–5.11)
RDW: 15.3 % (ref 11.5–15.5)
WBC: 10.3 10*3/uL (ref 4.0–10.5)
nRBC: 0 % (ref 0.0–0.2)

## 2024-02-03 MED ORDER — HYDRALAZINE HCL 20 MG/ML IJ SOLN: 10.0000 mg | Freq: Four times a day (QID) | INTRAMUSCULAR | Status: DC | PRN

## 2024-02-03 MED ORDER — OXYCODONE HCL 5 MG/5ML PO SOLN
5.0000 mg | ORAL | Status: DC | PRN
  Administered 2024-02-03: 5 mg via ORAL
  Filled 2024-02-03: qty 5

## 2024-02-03 NOTE — Plan of Care (Signed)

## 2024-02-03 NOTE — Progress Notes (Signed)
 Mobility Specialist Progress Note;   02/03/24 1050  Mobility  Activity Ambulated with assistance in hallway  Level of Assistance Standby assist, set-up cues, supervision of patient - no hands on  Assistive Device None  Distance Ambulated (ft) 800 ft  Activity Response Tolerated well  Mobility Referral Yes  Mobility visit 1 Mobility  Mobility Specialist Start Time (ACUTE ONLY) 1050  Mobility Specialist Stop Time (ACUTE ONLY) 1105  Mobility Specialist Time Calculation (min) (ACUTE ONLY) 15 min   Pt eager for mobility. On 4LO2 upon arrival. Required no physical assistance during ambulation, SV. Ambulated on 4LO2. Took 1x standing rest break d/t SOB. SPO2 desat to 86% during ambulation, however recovered quickly w/ standing rest break and PLB. No c/o when asked. Pt returned back to bed with all needs met.   Caesar Bookman Mobility Specialist Please contact via SecureChat or [employer] [phone]

## 2024-02-03 NOTE — Discharge Instructions (Signed)
 Diet: soft diet for 2 weeks.... crush all pills and take in applesauce  You may shower, wash incisions with soap and water daily  No driving until cleared by Dr. Lucilla Lame office  Ambulate at least 3 x per day  Contact office if you develop: fever 101.5, drainage from surgical incisions, uncontrollable nausea/vomiting

## 2024-02-03 NOTE — Discharge Summary (Addendum)
 Physician Discharge Summary  Patient ID: Kristen [name] MRN: [id number] DOB/AGE: 68-Jun-1957 68 y.o.  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   S/P repair of paraesophageal hernia 10/08/2023   Hiatal hernia 07/09/2023   Onychomycosis 04/12/2022   Decreased estrogen level 05/13/2021   Impaired fasting glucose 05/13/2021   Major depression, single episode 05/13/2021   Vitamin D deficiency 05/13/2021   Diverticulitis 03/23/2021   Essential hypertension 07/03/2014   Depression    H/O syncope    Discharge Diagnoses:   Patient Active Problem List   Diagnosis Date Noted   Right upper quadrant abdominal pain 02/02/2024   S/P robot-assisted gastroplexy 02/02/2024   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 She is S/P Paraesophageal hernia repair by Dr. Cliffton Asters in December. Her last visit with Dr. Cliffton Asters was on 1/24 at which time she felt she was doing well. She continued to experience some reflux at that time. She presented to the [facility] ED yesterday with complaints of RUQ abdominal pain x 4 days with the urge to vomit but was unable to do so. CT scan was obtained and showed gastric malrotation and evidence of fluid in and around her esophagus. NG tube was placed for Decompression. GI consult was obtained who performed upper Endoscopy which showed massive dilatation of stomach  concerning for gastric volvulus. There was also a large amount of retained food particles. The stomach was decompressed. The ED physician notified and spoke with Dr. Cliffton Asters who accepted patient for admission and requested transfer to [facility].  Prior to transfer the patient removed her NG tube, but did allow a new one to be placed.  Post endoscopy her abdominal pain was resolved and overall she was feeling much better.  Hospital Course:  Patient was brought to the short stay area on arrival to [facility].  She was evaluated by Dr. Cliffton Asters who felt the patient would require surgical intervention.  She was agreeable to proceed.  She was taken to the operating room and underwent Robotic Assisted Gastroplexy and EGD.  She tolerated the procedure without difficulty, was extubated and taken to the PACU in stable condition.  She denied abdominal pain, nausea, vomiting.  Her NG tube was removed without difficulty on POD #1.  She was started on a clear liquid diet.  She tolerated this without difficulty.  She had no nausea or vomiting.  She was advanced to a dysphagia 1 diet on POD #2.  She tolerated this diet also without issue.  She was instructed to adhering to this diet until she follows up with Dr. Cliffton Asters.  She was unable to fully wean off oxygen and will require 1L via [place] at discharge.  We will continue to wean at discharge. She is ambulating without difficulty.  Her surgical incisions are healing without evidence of infection.  She is stable for discharge home today.  Consults: None  Significant Diagnostic Studies: radiology: CT scan  FINDINGS: Lower chest: Small moderate fluid in the distal esophagus with mild wall thickening and mucosal enhancement. There is mediastinal fluid collection to the left of the distal esophagus, incompletely visualized. No pleural or pericardial effusion. Nodules in the lateral left lower lobe measuring up to 5 mm(Im7,Se89) , and a 9 mm subpleural nodule in the lateral  basal segment right lower lobe image 9, all stable since previous.   Hepatobiliary: No focal liver abnormality is seen. No gallstones, gallbladder wall thickening, or biliary dilatation.   Pancreas: Unremarkable. No pancreatic ductal dilatation or surrounding inflammatory changes.   Spleen: Normal in size without focal abnormality.   Adrenals/Urinary Tract: No adrenal mass. Symmetric renal parenchymal enhancement without focal lesion or hydronephrosis. Urinary bladder physiologically distended.   Stomach/Bowel: There is massive gas and fluid distention of the stomach, with the fundus directed to the right, and the pylorus retracted back to the left upper quadrant. No wall thickening or pneumatosis. No evidence of obstructing mass. Small bowel is decompressed. Normal appendix. The colon is partially distended, with scattered sigmoid diverticula; no adjacent inflammatory change.   Vascular/Lymphatic: Moderate calcified aortoiliac plaque without aneurysm. Portal vein patent. No abdominal or pelvic adenopathy.   Reproductive: Uterus and bilateral adnexa are unremarkable.   Other: Trace pelvic fluid.  No abdominal ascites.  No free air.   Musculoskeletal: No acute or significant osseous findings.   IMPRESSION: 1. Gastric malrotation with dilatation. No evidence of obstructing mass or wall thickening. 2. Fluid in the distal esophagus with mild wall thickening and mucosal enhancement, suggesting reflux esophagitis. 3. Incompletely visualized mediastinal fluid collection to the left of the distal esophagus. 4. Stable bilateral lower lobe pulmonary nodules measuring up to 9 mm. 5. Sigmoid diverticulosis.     Electronically Signed   By: Corlis Leak M.D.   On: 02/01/2024 15:21   Treatments: surgery:    02/02/2024   4:01 PM   PATIENT:  Kristen [name]  68 y.o. female   PRE-OPERATIVE DIAGNOSIS:  gastric volvulus   POST-OPERATIVE DIAGNOSIS:  gastric volvulus   PROCEDURE:    ROBOT-ASSISTED LAPAROSCOPY WITH GASTROPEXY  EGD (ESOPHAGOGASTRODUODENOSCOPY)    SURGEON:  Surgeons and Role:    * [name], MD - Primary   PHYSICIAN ASSISTANT: Aloha Gell PA-C, Haywood Lasso PA-S      Discharge Exam: Blood pressure 135/70, pulse 70, temperature 98.8 F (37.1 C), temperature source Oral, resp. rate 16, height 5\' 3"  (1.6 m), weight 67.1 kg, last menstrual period 11/24/2003, SpO2 98%.  General appearance: alert, cooperative, and no distress Heart: regular rate and rhythm Lungs: clear to auscultation bilaterally Abdomen: soft, non-tender; bowel sounds normal; no masses,  no organomegaly Extremities: extremities normal, atraumatic, no cyanosis or edema Wound: clean and dry  Disposition: Discharge disposition: 01-Home or Self Care        Allergies as of 02/04/2024       Reactions   Propoxyphene Swelling   Other Other (See Comments)   All antibiotic causes yeast infection        Medication List     STOP taking these medications    BUTCHERS BROOM PO   FT RED YEAST RICE PO   NON FORMULARY   OVER THE COUNTER MEDICATION   OVER THE COUNTER MEDICATION   OVER THE COUNTER MEDICATION   OVER THE COUNTER MEDICATION   OVER THE COUNTER MEDICATION   OVER THE COUNTER MEDICATION   OVER THE COUNTER MEDICATION   OVER THE COUNTER MEDICATION   OVER THE COUNTER  MEDICATION   OVER THE COUNTER MEDICATION   VITAMIN E PO       TAKE these medications    acetaminophen 325 MG tablet Commonly known as: TYLENOL Take 650 mg by mouth every 6 (six) hours as needed for headache.   aspirin EC 81 MG tablet Take 81 mg by mouth at bedtime. Swallow whole.   benazepril 20 MG tablet Commonly known as: LOTENSIN Take 10 mg by mouth daily.   buPROPion 300 MG 24 hr tablet Commonly known as: WELLBUTRIN XL Take 300 mg by mouth daily.   FLUoxetine 40 MG capsule Commonly known as: PROZAC Take 80 mg by mouth daily.   ondansetron 4 MG  tablet Commonly known as: Zofran Take 1 tablet (4 mg total) by mouth every 8 (eight) hours as needed for nausea or vomiting.   oxyCODONE 5 MG immediate release tablet Commonly known as: Roxicodone Take 1 tablet (5 mg total) by mouth every 4 (four) hours as needed for severe pain (pain score 7-10).   simethicone 80 MG chewable tablet Commonly known as: MYLICON Chew 1 tablet (80 mg total) by mouth every 6 (six) hours as needed for flatulence.               Durable Medical Equipment  (From admission, onward)           Start     Ordered   02/04/24 1314  For home use only DME oxygen  Once       Question Answer Comment  Length of Need 6 Months   Liters per Minute 1   Oxygen delivery system Gas      02/04/24 1313            Follow-up Information     [name], MD Follow up on 02/11/2024.   Specialty: Cardiothoracic Surgery Why: Appointment is at 9:50 Contact information: [address] [place] [address] [phone]                 Signed: [name], PA-C  02/04/2024, 1:13 PM

## 2024-02-03 NOTE — Anesthesia Postprocedure Evaluation (Signed)
 Anesthesia Post Note  Patient: Kristen [name]  Procedure(s) Performed: lAPAROSCOPY WITH   ROBOT-ASSISTED WITH GASTROPEXY (Chest) EGD (ESOPHAGOGASTRODUODENOSCOPY) (Abdomen)     Patient location during evaluation: PACU Anesthesia Type: General Level of consciousness: awake and alert Pain management: pain level controlled Vital Signs Assessment: post-procedure vital signs reviewed and stable Respiratory status: spontaneous breathing, nonlabored ventilation, respiratory function stable and patient connected to nasal cannula oxygen Cardiovascular status: blood pressure returned to baseline and stable Postop Assessment: no apparent nausea or vomiting Anesthetic complications: no   No notable events documented.           Mariann Barter

## 2024-02-03 NOTE — Plan of Care (Signed)

## 2024-02-03 NOTE — Progress Notes (Addendum)
      [address]       [place] [address]             [phone]      1 Day Post-Op Procedure(s) (LRB): lAPAROSCOPY WITH   ROBOT-ASSISTED WITH GASTROPEXY (N/A) EGD (ESOPHAGOGASTRODUODENOSCOPY) (N/A)  Subjective:  Patient is feeling good.  Denies abdominal pain.. N/V  Objective: Vital signs in last 24 hours: Temp:  [97.8 F (36.6 C)-99 F (37.2 C)] 98.1 F (36.7 C) (03/13 0745) Pulse Rate:  [78-98] 78 (03/13 0745) Cardiac Rhythm: Normal sinus rhythm (03/13 0753) Resp:  [16-27] 19 (03/13 0745) BP: (118-148)/(75-102) 128/78 (03/13 0745) SpO2:  [90 %-97 %] 95 % (03/13 0745)  Intake/Output from previous day: 03/12 0701 - 03/13 0700 In: 1613.1 [I.V.:1613.1] Out: -   General appearance: alert, cooperative, and no distress Heart: regular rate and rhythm Lungs: clear to auscultation bilaterally Abdomen: soft, non-tender; bowel sounds normal; no masses,  no organomegaly Extremities: extremities normal, atraumatic, no cyanosis or edema Wound: clean and dry  Lab Results: Recent Labs    02/02/24 1656 02/03/24 0219  WBC 12.8* 10.3  HGB 11.0* 9.8*  HCT 33.1* 30.4*  PLT 179 164   BMET:  Recent Labs    02/01/24 1151 02/02/24 1656 02/03/24 0219  NA 139  --  142  K 3.0*  --  4.0  CL 104  --  109  CO2 21*  --  25  GLUCOSE 221*  --  127*  BUN 19  --  23  CREATININE 0.72 0.73 0.79  CALCIUM 9.0  --  8.4*    PT/INR: No results for input(s): "LABPROT", "INR" in the last 72 hours. ABG No results found for: "PHART", "HCO3", "TCO2", "ACIDBASEDEF", "O2SAT" CBG (last 3)  No results for input(s): "GLUCAP" in the last 72 hours.  Assessment/Plan: S/P Procedure(s) (LRB): lAPAROSCOPY WITH   ROBOT-ASSISTED WITH GASTROPEXY (N/A) EGD (ESOPHAGOGASTRODUODENOSCOPY) (N/A)  CV- NSR, H/O HTN- BP okay for now.. will add prn Hydralazine, once diet is advanced can resume home Lotensin Pulm- no acute issues, continue IS Renal- stop IV fluids, lytes, creatinine are  stable GI- will d/c NG tube today, denies N/V.Marland Kitchen. start CLD Pain control- continue Toradol, stop morphine add prn oral oxycodone Dispo- patient doing well.. d/c NG tube, advance diet to clears.. will convert patient's liquid medications to tablets as soon as appropriate   LOS: 2 days    [name], PA-C 02/03/2024  Agree with above Advancing diet  Sidney Kann O Katalia Choma

## 2024-02-04 MED ORDER — SIMETHICONE 80 MG PO CHEW
80.0000 mg | CHEWABLE_TABLET | Freq: Four times a day (QID) | ORAL | Status: DC
  Administered 2024-02-04 (×2): 80 mg via ORAL
  Filled 2024-02-04 (×2): qty 1

## 2024-02-04 MED ORDER — OXYCODONE HCL 5 MG PO TABS: 5.0000 mg | ORAL_TABLET | ORAL | 0 refills | Status: AC | PRN

## 2024-02-04 MED ORDER — ONDANSETRON HCL 4 MG PO TABS: 4.0000 mg | ORAL_TABLET | Freq: Three times a day (TID) | ORAL | 0 refills | Status: AC | PRN

## 2024-02-04 MED ORDER — SIMETHICONE 80 MG PO CHEW: 80.0000 mg | CHEWABLE_TABLET | Freq: Four times a day (QID) | ORAL | Status: AC | PRN

## 2024-02-04 NOTE — Care Management Important Message (Signed)
 Important Message  Patient Details  Name: Kristen [name] MRN: [id number] Date of Birth: 1956/01/02   Important Message Given:  Yes - Medicare IM     Renie Ora 02/04/2024, 1:00 PM

## 2024-02-04 NOTE — Progress Notes (Signed)
 Patient taken down to discharge lounge with family member still awaiting home oxygen to be delivered.

## 2024-02-04 NOTE — TOC Transition Note (Signed)
 Transition of Care [facility]) - Discharge Note   Patient Details  Name: Kristen [name] MRN: [id number] Date of Birth: 1956/05/07  Transition of [facility]) CM/SW Contact:  Tom-Johnson, Hershal Coria, RN Phone Number: 02/04/2024, 1:51 PM   Clinical Narrative:     Patient is scheduled for discharge today.  Readmission Risk Assessment done. Outpatient f/u, hospital f/u and discharge instructions on AVS. Home O2 ordered from [employer] and [employer] to deliver portable tank to patient prior to discharge. Patient has no preference.  Daughter in-law, Florentina Addison to transport at discharge.  No further TOC needs noted.       Final next level of care: Home/Self Care Barriers to Discharge: Barriers Resolved   Patient Goals and CMS Choice Patient states their goals for this hospitalization and ongoing recovery are:: To return home CMS Medicare.gov Compare Post Acute Care list provided to:: Patient Choice offered to / list presented to : Patient      Discharge Placement                Patient to be transferred to facility by: Daughter in-law Name of family member notified: Katie    Discharge Plan and Services Additional resources added to the After Visit Summary for                  DME Arranged: Oxygen DME Agency: [employer] Date DME Agency Contacted: 02/04/24 Time DME Agency Contacted: 1322 Representative spoke with at DME Agency: Vaughan Basta HH Arranged: NA HH Agency: NA        Social Drivers of Health (SDOH) Interventions SDOH Screenings   Food Insecurity: No Food Insecurity (02/02/2024)  Housing: Low Risk  (02/02/2024)  Transportation Needs: No Transportation Needs (02/02/2024)  Utilities: Not At Risk (02/02/2024)  Social Connections: Moderately Integrated (02/02/2024)  Tobacco Use: Medium Risk (02/02/2024)     Readmission Risk Interventions    02/04/2024    1:48 PM 10/28/2023    3:44 PM  Readmission Risk Prevention Plan  Post Dischage Appt Complete Complete   Medication Screening Complete Complete  Transportation Screening Complete Complete

## 2024-02-04 NOTE — Progress Notes (Signed)
 Mobility Specialist Progress Note;   02/04/24 0952  Mobility  Activity Ambulated with assistance in hallway  Level of Assistance Standby assist, set-up cues, supervision of patient - no hands on  Assistive Device None  Distance Ambulated (ft) 1200 ft  Activity Response Tolerated well  Mobility Referral Yes  Mobility visit 1 Mobility  Mobility Specialist Start Time (ACUTE ONLY) [id number]  Mobility Specialist Stop Time (ACUTE ONLY) 1010  Mobility Specialist Time Calculation (min) (ACUTE ONLY) 18 min   Pt eager for mobility. On 1LO2 upon arrival. Required no physical assistance during ambulation, SV. Attempted to ambulate on RA, however SPO2 dropped to 86%. Required 1LO2 while ambulating to stay [facility]. No c/o when asked. Pt returned back to bed with all needs met, on 1LO2. Family in room.   Caesar Bookman Mobility Specialist Please contact via SecureChat or [employer] [phone]

## 2024-02-04 NOTE — Progress Notes (Signed)
 Patient ambulated in hallway with no complications, while on RA and didn't decrease below 88% SPO2

## 2024-02-04 NOTE — Progress Notes (Signed)
 SATURATION QUALIFICATIONS: (This note is used to comply with regulatory documentation for home oxygen)  Patient Saturations on Room Air at Rest = 88%  Patient Saturations on Room Air while Ambulating = 86%  Patient Saturations on 1 Liters of oxygen while Ambulating = 95%  Please briefly explain why patient needs home oxygen:

## 2024-02-04 NOTE — Progress Notes (Signed)
 Removed two IV's, went over discharge paperwork and all questions answered. Waiting on oxygen from [employer] to be delivered.

## 2024-02-04 NOTE — Progress Notes (Addendum)
      [address]       [name] [address]             [phone]      2 Days Post-Op Procedure(s) (LRB): lAPAROSCOPY WITH   ROBOT-ASSISTED WITH GASTROPEXY (N/A) EGD (ESOPHAGOGASTRODUODENOSCOPY) (N/A)  Subjective:  Patient doing well.  Tolerating liquids w/o N/V.... she does have some gas pain along her left flank area.  She is passing gas, but no BM yet  Objective: Vital signs in last 24 hours: Temp:  [98.4 F (36.9 C)-98.9 F (37.2 C)] 98.8 F (37.1 C) (03/14 0720) Pulse Rate:  [70-82] 70 (03/14 0506) Cardiac Rhythm: Normal sinus rhythm (03/13 1935) Resp:  [16-18] 16 (03/14 0506) BP: (103-138)/(70-88) 135/70 (03/14 0720) SpO2:  [94 %-99 %] 98 % (03/14 0506)  Intake/Output from previous day: 03/13 0701 - 03/14 0700 In: 240 [P.O.:240] Out: -   General appearance: alert, cooperative, and no distress Heart: regular rate and rhythm Lungs: clear to auscultation bilaterally Abdomen: soft, non-tender; bowel sounds normal; no masses,  no organomegaly Extremities: extremities normal, atraumatic, no cyanosis or edema Wound: clean and dry  Lab Results: Recent Labs    02/02/24 1656 02/03/24 0219  WBC 12.8* 10.3  HGB 11.0* 9.8*  HCT 33.1* 30.4*  PLT 179 164   BMET:  Recent Labs    02/01/24 1151 02/02/24 1656 02/03/24 0219  NA 139  --  142  K 3.0*  --  4.0  CL 104  --  109  CO2 21*  --  25  GLUCOSE 221*  --  127*  BUN 19  --  23  CREATININE 0.72 0.73 0.79  CALCIUM 9.0  --  8.4*    PT/INR: No results for input(s): "LABPROT", "INR" in the last 72 hours. ABG No results found for: "PHART", "HCO3", "TCO2", "ACIDBASEDEF", "O2SAT" CBG (last 3)  No results for input(s): "GLUCAP" in the last 72 hours.  Assessment/Plan: S/P Procedure(s) (LRB): lAPAROSCOPY WITH   ROBOT-ASSISTED WITH GASTROPEXY (N/A) EGD (ESOPHAGOGASTRODUODENOSCOPY) (N/A)  CV- NSR, BP is stable, continue prn Hydralazine for now Pulm- no acute issues, not requiring oxygen GI-  denies N/V... will advance diet today, suspect okay to progress to dyshpagia will discuss with Dr.Marlette Curvin Dispo- patient doing well, tolerating liquid diet, will advance today after discussion with Dr. Cliffton Asters.Marland Kitchen denies N/V.... possibly ready for d/c today vs tomorrow   LOS: 3 days    [name], PA-C 02/04/2024   Agree with above Advancing diet Dispo planning  [name] [name]

## 2024-02-11 ENCOUNTER — Telehealth: Payer: Self-pay | Admitting: *Deleted

## 2024-02-11 ENCOUNTER — Ambulatory Visit: Payer: Self-pay | Admitting: Thoracic Surgery (Cardiothoracic Vascular Surgery)

## 2024-02-11 VITALS — BP 123/61 | HR 60 | Resp 18 | Ht 63.0 in | Wt 151.0 lb

## 2024-02-11 DIAGNOSIS — I10 Essential (primary) hypertension: Secondary | ICD-10-CM | POA: Diagnosis not present

## 2024-02-11 DIAGNOSIS — Z9889 Other specified postprocedural states: Secondary | ICD-10-CM

## 2024-02-11 DIAGNOSIS — K3189 Other diseases of stomach and duodenum: Secondary | ICD-10-CM | POA: Diagnosis not present

## 2024-02-11 NOTE — Progress Notes (Signed)
      [address]       [place] [address]             [phone]        MURL ZOGG [facility] Medical Record #[id number] Date of Birth: 15-Mar-1956  Referring: [name], DO Primary Care: Mila Palmer, MD Primary Cardiologist:Jayadeep Eldridge Dace, MD  Reason for visit:   follow-up  History of Present Illness:     68 year old female presents for follow-up.  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  She is doing well.  She denies any bloating or dysphagia.  Physical Exam: BP 123/61 (BP Location: Right Arm)   Pulse 60   Resp 18   Ht 5\' 3"  (1.6 m)   Wt 151 lb (68.5 kg)   LMP 11/24/2003   SpO2 100% Comment: 1L O2  BMI 26.75 kg/m   Alert NAD Abdomen, ND No peripheral edema     Assessment / Plan:   68 year old female who underwent a gastropexy for gastric volvulus.  She previously had a type IV hiatal hernia repair in December of last year.  Her 6-minute walk test demonstrated good saturations that she no longer needs any oxygen.  I will follow-up with her with her standard 56-month appointment.   [name] 02/11/2024 11:45 AM

## 2024-02-11 NOTE — Telephone Encounter (Signed)
 6 minute walk test:  Patient ambulated 1,056 ft a total of 11 laps on RA. O2 saturations stayed above 97%. Denied SOB.  Per Dr. Cliffton Asters, fax sent to [facility] for O2 equipment pick up.

## 2024-02-18 ENCOUNTER — Ambulatory Visit: Admitting: Thoracic Surgery (Cardiothoracic Vascular Surgery)

## 2024-03-03 ENCOUNTER — Telehealth: Admitting: Thoracic Surgery (Cardiothoracic Vascular Surgery)

## 2024-03-03 ENCOUNTER — Telehealth: Payer: Medicare PPO | Admitting: Thoracic Surgery (Cardiothoracic Vascular Surgery)

## 2024-03-03 DIAGNOSIS — H9313 Tinnitus, bilateral: Secondary | ICD-10-CM | POA: Diagnosis not present

## 2024-03-03 DIAGNOSIS — H90A32 Mixed conductive and sensorineural hearing loss, unilateral, left ear with restricted hearing on the contralateral side: Secondary | ICD-10-CM | POA: Diagnosis not present

## 2024-03-03 DIAGNOSIS — H903 Sensorineural hearing loss, bilateral: Secondary | ICD-10-CM | POA: Diagnosis not present

## 2024-03-03 DIAGNOSIS — H90A21 Sensorineural hearing loss, unilateral, right ear, with restricted hearing on the contralateral side: Secondary | ICD-10-CM | POA: Diagnosis not present

## 2024-03-09 DIAGNOSIS — H90A32 Mixed conductive and sensorineural hearing loss, unilateral, left ear with restricted hearing on the contralateral side: Secondary | ICD-10-CM | POA: Diagnosis not present

## 2024-03-09 DIAGNOSIS — Z0110 Encounter for examination of ears and hearing without abnormal findings: Secondary | ICD-10-CM | POA: Diagnosis not present

## 2024-03-31 ENCOUNTER — Ambulatory Visit
Payer: Self-pay | Attending: Thoracic Surgery (Cardiothoracic Vascular Surgery) | Admitting: Thoracic Surgery (Cardiothoracic Vascular Surgery)

## 2024-03-31 DIAGNOSIS — K449 Diaphragmatic hernia without obstruction or gangrene: Secondary | ICD-10-CM | POA: Diagnosis not present

## 2024-03-31 NOTE — Progress Notes (Signed)
     [address]       [name] [address]             [phone]       Patient: Home Provider: Office Consent for Telemedicine visit obtained.  Today's visit was completed via a real-time telehealth (see specific modality noted below). The patient/authorized person provided oral consent at the time of the visit to engage in a telemedicine encounter with the present provider at [facility]. The patient/authorized person was informed of the potential benefits, limitations, and risks of telemedicine. The patient/authorized person expressed understanding that the laws that protect confidentiality also apply to telemedicine. The patient/authorized person acknowledged understanding that telemedicine does not provide emergency services and that he or she would need to call 911 or proceed to the nearest hospital for help if such a need arose.   Total time spent in the clinical discussion 10 minutes.  Telehealth Modality: Phone visit (audio only)  I had a telephone visit with Kristen [name] Overall, she is doing well.  She has no reflux or shortness of breath.  She occasionally has some dysphagia with pills.  She will follow-up as needed.  [name]

## 2024-04-05 ENCOUNTER — Telehealth: Payer: Self-pay | Admitting: Pulmonary Disease

## 2024-04-05 DIAGNOSIS — R911 Solitary pulmonary nodule: Secondary | ICD-10-CM

## 2024-04-05 NOTE — Telephone Encounter (Signed)
 Hey Dr. Waylan Haggard,  Would you mind putting in a new referral for this CT? It is expired and was done by a provider who no longer is with out office. Please advise.  Thanks!

## 2024-04-05 NOTE — Telephone Encounter (Signed)
 Thanks! NFN

## 2024-04-05 NOTE — Telephone Encounter (Signed)
 Order has been placed.

## 2024-04-11 ENCOUNTER — Ambulatory Visit (HOSPITAL_COMMUNITY)

## 2024-04-14 DIAGNOSIS — X58XXXA Exposure to other specified factors, initial encounter: Secondary | ICD-10-CM | POA: Diagnosis not present

## 2024-04-14 DIAGNOSIS — H18891 Other specified disorders of cornea, right eye: Secondary | ICD-10-CM | POA: Diagnosis not present

## 2024-04-14 DIAGNOSIS — S0501XA Injury of conjunctiva and corneal abrasion without foreign body, right eye, initial encounter: Secondary | ICD-10-CM | POA: Diagnosis not present

## 2024-04-20 ENCOUNTER — Ambulatory Visit (HOSPITAL_COMMUNITY)
Admission: RE | Admit: 2024-04-20 | Discharge: 2024-04-20 | Disposition: A | Discharge status: Home / Self Care | Source: Ambulatory Visit | Attending: Pulmonary Disease | Admitting: Pulmonary Disease

## 2024-04-20 DIAGNOSIS — R911 Solitary pulmonary nodule: Secondary | ICD-10-CM | POA: Diagnosis not present

## 2024-04-20 DIAGNOSIS — I700 Atherosclerosis of aorta: Secondary | ICD-10-CM | POA: Diagnosis not present

## 2024-04-20 DIAGNOSIS — R918 Other nonspecific abnormal finding of lung field: Secondary | ICD-10-CM | POA: Diagnosis not present

## 2024-04-20 DIAGNOSIS — E041 Nontoxic single thyroid nodule: Secondary | ICD-10-CM | POA: Diagnosis not present

## 2024-05-08 ENCOUNTER — Ambulatory Visit: Payer: Self-pay | Admitting: Pulmonary Disease

## 2024-05-08 DIAGNOSIS — R911 Solitary pulmonary nodule: Secondary | ICD-10-CM

## 2024-09-19 ENCOUNTER — Other Ambulatory Visit: Payer: Self-pay | Admitting: Family Medicine

## 2024-09-19 DIAGNOSIS — R918 Other nonspecific abnormal finding of lung field: Secondary | ICD-10-CM

## 2024-09-26 ENCOUNTER — Encounter: Payer: Self-pay | Admitting: Family Medicine

## 2024-10-04 ENCOUNTER — Ambulatory Visit
Admission: RE | Admit: 2024-10-04 | Discharge: 2024-10-04 | Disposition: A | Discharge status: Home / Self Care | Source: Ambulatory Visit | Attending: Family Medicine | Admitting: Family Medicine

## 2024-10-04 DIAGNOSIS — R918 Other nonspecific abnormal finding of lung field: Secondary | ICD-10-CM
